# Patient Record
Sex: Female | Born: 1955
Health system: Southern US, Community
[De-identification: ages and names within clinical notes are randomized; demographics above are authoritative.]

## PROBLEM LIST (undated history)

## (undated) DIAGNOSIS — T7840XA Allergy, unspecified, initial encounter: Secondary | ICD-10-CM

## (undated) HISTORY — DX: Allergy, unspecified, initial encounter: T78.40XA

---

## 1998-04-01 ENCOUNTER — Ambulatory Visit (HOSPITAL_COMMUNITY): Admission: RE | Admit: 1998-04-01 | Discharge: 1998-04-01 | Payer: Self-pay | Admitting: Family Medicine

## 1998-08-19 ENCOUNTER — Other Ambulatory Visit: Admission: RE | Admit: 1998-08-19 | Discharge: 1998-08-19 | Payer: Self-pay | Admitting: Specialist

## 1999-07-01 ENCOUNTER — Other Ambulatory Visit: Admission: RE | Admit: 1999-07-01 | Discharge: 1999-07-01 | Payer: Self-pay | Admitting: Gynecology

## 2000-05-13 ENCOUNTER — Encounter: Payer: Self-pay | Admitting: Gynecology

## 2000-05-13 ENCOUNTER — Encounter: Admission: RE | Admit: 2000-05-13 | Discharge: 2000-05-13 | Payer: Self-pay | Admitting: Gynecology

## 2000-05-18 ENCOUNTER — Encounter: Payer: Self-pay | Admitting: Family Medicine

## 2000-05-18 ENCOUNTER — Encounter: Admission: RE | Admit: 2000-05-18 | Discharge: 2000-05-18 | Payer: Self-pay | Admitting: Family Medicine

## 2000-05-26 ENCOUNTER — Other Ambulatory Visit: Admission: RE | Admit: 2000-05-26 | Discharge: 2000-05-26 | Payer: Self-pay | Admitting: Gynecology

## 2000-12-01 ENCOUNTER — Encounter: Payer: Self-pay | Admitting: Family Medicine

## 2000-12-01 ENCOUNTER — Ambulatory Visit (HOSPITAL_COMMUNITY): Admission: RE | Admit: 2000-12-01 | Discharge: 2000-12-01 | Payer: Self-pay | Admitting: Family Medicine

## 2001-05-08 ENCOUNTER — Other Ambulatory Visit: Admission: RE | Admit: 2001-05-08 | Discharge: 2001-05-08 | Payer: Self-pay | Admitting: Gynecology

## 2001-05-15 ENCOUNTER — Encounter: Payer: Self-pay | Admitting: Gynecology

## 2001-05-15 ENCOUNTER — Encounter: Admission: RE | Admit: 2001-05-15 | Discharge: 2001-05-15 | Payer: Self-pay | Admitting: Gynecology

## 2002-06-12 ENCOUNTER — Encounter: Admission: RE | Admit: 2002-06-12 | Discharge: 2002-06-12 | Payer: Self-pay | Admitting: Gynecology

## 2002-06-12 ENCOUNTER — Encounter: Payer: Self-pay | Admitting: Gynecology

## 2002-06-20 ENCOUNTER — Other Ambulatory Visit: Admission: RE | Admit: 2002-06-20 | Discharge: 2002-06-20 | Payer: Self-pay | Admitting: Gynecology

## 2002-08-21 ENCOUNTER — Other Ambulatory Visit: Admission: RE | Admit: 2002-08-21 | Discharge: 2002-08-21 | Payer: Self-pay | Admitting: Gynecology

## 2002-12-31 ENCOUNTER — Other Ambulatory Visit: Admission: RE | Admit: 2002-12-31 | Discharge: 2002-12-31 | Payer: Self-pay | Admitting: Gynecology

## 2003-04-04 ENCOUNTER — Other Ambulatory Visit: Admission: RE | Admit: 2003-04-04 | Discharge: 2003-04-04 | Payer: Self-pay | Admitting: Gynecology

## 2003-06-14 ENCOUNTER — Encounter: Admission: RE | Admit: 2003-06-14 | Discharge: 2003-06-14 | Payer: Self-pay | Admitting: Gynecology

## 2003-06-14 ENCOUNTER — Encounter: Payer: Self-pay | Admitting: Gynecology

## 2003-10-01 ENCOUNTER — Other Ambulatory Visit: Admission: RE | Admit: 2003-10-01 | Discharge: 2003-10-01 | Payer: Self-pay | Admitting: Gynecology

## 2004-03-30 ENCOUNTER — Other Ambulatory Visit: Admission: RE | Admit: 2004-03-30 | Discharge: 2004-03-30 | Payer: Self-pay | Admitting: Gynecology

## 2004-07-06 ENCOUNTER — Encounter: Admission: RE | Admit: 2004-07-06 | Discharge: 2004-07-06 | Payer: Self-pay | Admitting: Gynecology

## 2004-10-17 ENCOUNTER — Emergency Department (HOSPITAL_COMMUNITY): Admission: EM | Admit: 2004-10-17 | Discharge: 2004-10-17 | Payer: Self-pay | Admitting: *Deleted

## 2004-11-16 ENCOUNTER — Other Ambulatory Visit: Admission: RE | Admit: 2004-11-16 | Discharge: 2004-11-16 | Payer: Self-pay | Admitting: Gynecology

## 2005-07-12 ENCOUNTER — Encounter: Admission: RE | Admit: 2005-07-12 | Discharge: 2005-07-12 | Payer: Self-pay | Admitting: Gynecology

## 2005-11-18 ENCOUNTER — Other Ambulatory Visit: Admission: RE | Admit: 2005-11-18 | Discharge: 2005-11-18 | Payer: Self-pay | Admitting: Gynecology

## 2006-07-13 ENCOUNTER — Encounter: Admission: RE | Admit: 2006-07-13 | Discharge: 2006-07-13 | Payer: Self-pay | Admitting: Gynecology

## 2007-01-09 ENCOUNTER — Other Ambulatory Visit: Admission: RE | Admit: 2007-01-09 | Discharge: 2007-01-09 | Payer: Self-pay | Admitting: Gynecology

## 2007-07-17 ENCOUNTER — Encounter: Admission: RE | Admit: 2007-07-17 | Discharge: 2007-07-17 | Payer: Self-pay | Admitting: Gynecology

## 2007-07-20 ENCOUNTER — Encounter: Admission: RE | Admit: 2007-07-20 | Discharge: 2007-07-20 | Payer: Self-pay | Admitting: Gynecology

## 2008-07-17 ENCOUNTER — Encounter: Admission: RE | Admit: 2008-07-17 | Discharge: 2008-07-17 | Payer: Self-pay | Admitting: Gynecology

## 2009-07-21 ENCOUNTER — Encounter: Admission: RE | Admit: 2009-07-21 | Discharge: 2009-07-21 | Payer: Self-pay | Admitting: Gynecology

## 2010-07-22 ENCOUNTER — Encounter: Admission: RE | Admit: 2010-07-22 | Discharge: 2010-07-22 | Payer: Self-pay | Admitting: Gynecology

## 2011-06-17 ENCOUNTER — Other Ambulatory Visit: Payer: Self-pay | Admitting: Gynecology

## 2011-06-17 DIAGNOSIS — Z1231 Encounter for screening mammogram for malignant neoplasm of breast: Secondary | ICD-10-CM

## 2011-07-26 ENCOUNTER — Ambulatory Visit
Admission: RE | Admit: 2011-07-26 | Discharge: 2011-07-26 | Disposition: A | Discharge status: Home / Self Care | Payer: BC Managed Care – PPO | Source: Ambulatory Visit | Attending: Gynecology | Admitting: Gynecology

## 2011-07-26 DIAGNOSIS — Z1231 Encounter for screening mammogram for malignant neoplasm of breast: Secondary | ICD-10-CM

## 2012-07-04 ENCOUNTER — Other Ambulatory Visit: Payer: Self-pay | Admitting: Gynecology

## 2012-07-04 DIAGNOSIS — Z1231 Encounter for screening mammogram for malignant neoplasm of breast: Secondary | ICD-10-CM

## 2012-07-26 ENCOUNTER — Ambulatory Visit
Admission: RE | Admit: 2012-07-26 | Discharge: 2012-07-26 | Disposition: A | Discharge status: Home / Self Care | Payer: BC Managed Care – PPO | Source: Ambulatory Visit | Attending: Gynecology | Admitting: Gynecology

## 2012-07-26 DIAGNOSIS — Z1231 Encounter for screening mammogram for malignant neoplasm of breast: Secondary | ICD-10-CM

## 2013-06-26 ENCOUNTER — Other Ambulatory Visit: Payer: Self-pay

## 2013-06-26 DIAGNOSIS — Z1231 Encounter for screening mammogram for malignant neoplasm of breast: Secondary | ICD-10-CM

## 2013-07-27 ENCOUNTER — Ambulatory Visit: Payer: BC Managed Care – PPO

## 2013-08-02 ENCOUNTER — Ambulatory Visit
Admission: RE | Admit: 2013-08-02 | Discharge: 2013-08-02 | Disposition: A | Discharge status: Home / Self Care | Payer: BC Managed Care – PPO | Source: Ambulatory Visit

## 2013-08-02 DIAGNOSIS — Z1231 Encounter for screening mammogram for malignant neoplasm of breast: Secondary | ICD-10-CM

## 2014-01-02 ENCOUNTER — Other Ambulatory Visit: Payer: Self-pay | Admitting: Family Medicine

## 2014-01-02 ENCOUNTER — Ambulatory Visit
Admission: RE | Admit: 2014-01-02 | Discharge: 2014-01-02 | Disposition: A | Discharge status: Home / Self Care | Payer: BC Managed Care – PPO | Source: Ambulatory Visit | Attending: Family Medicine | Admitting: Family Medicine

## 2014-01-02 DIAGNOSIS — M79609 Pain in unspecified limb: Secondary | ICD-10-CM

## 2014-07-08 ENCOUNTER — Other Ambulatory Visit: Payer: Self-pay

## 2014-07-08 DIAGNOSIS — Z1231 Encounter for screening mammogram for malignant neoplasm of breast: Secondary | ICD-10-CM

## 2014-08-05 ENCOUNTER — Ambulatory Visit
Admission: RE | Admit: 2014-08-05 | Discharge: 2014-08-05 | Disposition: A | Discharge status: Home / Self Care | Payer: BC Managed Care – PPO | Source: Ambulatory Visit

## 2014-08-05 DIAGNOSIS — Z1231 Encounter for screening mammogram for malignant neoplasm of breast: Secondary | ICD-10-CM

## 2015-10-29 ENCOUNTER — Other Ambulatory Visit: Payer: Self-pay | Admitting: Gynecology

## 2015-10-29 DIAGNOSIS — R928 Other abnormal and inconclusive findings on diagnostic imaging of breast: Secondary | ICD-10-CM

## 2015-10-31 ENCOUNTER — Ambulatory Visit
Admission: RE | Admit: 2015-10-31 | Discharge: 2015-10-31 | Disposition: A | Discharge status: Home / Self Care | Payer: BC Managed Care – PPO | Source: Ambulatory Visit | Attending: Gynecology | Admitting: Gynecology

## 2015-10-31 DIAGNOSIS — R928 Other abnormal and inconclusive findings on diagnostic imaging of breast: Secondary | ICD-10-CM

## 2016-09-23 ENCOUNTER — Other Ambulatory Visit: Payer: Self-pay | Admitting: Gynecology

## 2016-09-23 DIAGNOSIS — Z1231 Encounter for screening mammogram for malignant neoplasm of breast: Secondary | ICD-10-CM

## 2016-10-29 ENCOUNTER — Ambulatory Visit
Admission: RE | Admit: 2016-10-29 | Discharge: 2016-10-29 | Disposition: A | Discharge status: Home / Self Care | Payer: BC Managed Care – PPO | Source: Ambulatory Visit | Attending: Gynecology | Admitting: Gynecology

## 2016-10-29 DIAGNOSIS — Z1231 Encounter for screening mammogram for malignant neoplasm of breast: Secondary | ICD-10-CM

## 2017-09-21 ENCOUNTER — Other Ambulatory Visit: Payer: Self-pay | Admitting: Gynecology

## 2017-09-21 DIAGNOSIS — Z1231 Encounter for screening mammogram for malignant neoplasm of breast: Secondary | ICD-10-CM

## 2017-10-31 ENCOUNTER — Encounter (INDEPENDENT_AMBULATORY_CARE_PROVIDER_SITE_OTHER): Payer: Self-pay

## 2017-10-31 ENCOUNTER — Ambulatory Visit
Admission: RE | Admit: 2017-10-31 | Discharge: 2017-10-31 | Disposition: A | Discharge status: Home / Self Care | Payer: BC Managed Care – PPO | Source: Ambulatory Visit | Attending: Gynecology | Admitting: Gynecology

## 2017-10-31 DIAGNOSIS — Z1231 Encounter for screening mammogram for malignant neoplasm of breast: Secondary | ICD-10-CM

## 2018-09-19 ENCOUNTER — Other Ambulatory Visit: Payer: Self-pay | Admitting: Gynecology

## 2018-09-19 DIAGNOSIS — Z1231 Encounter for screening mammogram for malignant neoplasm of breast: Secondary | ICD-10-CM

## 2018-11-01 ENCOUNTER — Ambulatory Visit
Admission: RE | Admit: 2018-11-01 | Discharge: 2018-11-01 | Disposition: A | Discharge status: Home / Self Care | Payer: BC Managed Care – PPO | Source: Ambulatory Visit | Attending: Gynecology | Admitting: Gynecology

## 2018-11-01 DIAGNOSIS — Z1231 Encounter for screening mammogram for malignant neoplasm of breast: Secondary | ICD-10-CM

## 2019-01-09 ENCOUNTER — Encounter: Payer: Self-pay | Admitting: Internal Medicine

## 2019-02-05 ENCOUNTER — Other Ambulatory Visit: Payer: Self-pay

## 2019-02-05 ENCOUNTER — Telehealth: Payer: Self-pay | Admitting: Internal Medicine

## 2019-02-05 ENCOUNTER — Encounter: Payer: Self-pay | Admitting: Internal Medicine

## 2019-02-05 ENCOUNTER — Ambulatory Visit (INDEPENDENT_AMBULATORY_CARE_PROVIDER_SITE_OTHER): Payer: BC Managed Care – PPO | Admitting: Internal Medicine

## 2019-02-05 DIAGNOSIS — L649 Androgenic alopecia, unspecified: Secondary | ICD-10-CM

## 2019-02-05 DIAGNOSIS — E278 Other specified disorders of adrenal gland: Secondary | ICD-10-CM | POA: Diagnosis not present

## 2019-02-05 DIAGNOSIS — R7989 Other specified abnormal findings of blood chemistry: Secondary | ICD-10-CM | POA: Insufficient documentation

## 2019-02-05 NOTE — Telephone Encounter (Signed)
No need to stop any of the other medicines. She may want to discuss any concerns with the prescribing physician.

## 2019-02-05 NOTE — Progress Notes (Signed)
Virtual Visit via Video Note  I connected with Tammy [name] on 02/05/19 at  9:10 AM EDT by a video enabled telemedicine application and verified that I am speaking with the correct person using two identifiers.   I discussed the limitations of evaluation and management by telemedicine and the availability of in person appointments. The patient expressed understanding and agreed to proceed.  -Location of the patient : Home -Location of the provider : office -The names of all persons participating in the telemedicine service : CMA Lelon Frohlichashia Golden     Name: Tammy [name]  MRN/ DOB: [id number], 05/04/1956    Age/ Sex: 63 y.o., female    PCP: Johny BlamerHarris, William, MD   Reason for Endocrinology Evaluation: Androgenic alopecia     Date of Initial Endocrinology Evaluation: 02/05/2019     HPI: Tammy [name] is a 63 y.o. female with a past medical history of seasonal allergies. The patient presented for initial endocrinology clinic visit on 02/05/2019 for consultative assistance with her Androgenic Alopecia  Pt was referred to us by dermatology for the possibility of androgenic alopecia after she was found to have an elevated DHEAS of 190 (reference 12-133 mcg/dL) , with normal testosterone profile (total and free)   Pt noted excessive hair loss for ~ 4 -5 months prior to her presentation. She also noted pruritus at the area of concern and has been scratching her scalp. During dermatology visit she was noted to have hair thinning over the left vertex scalp.   Pt does see a hair stylist every 2 weeks for wash, styling and hair drying, she also has a hair relaxer applied every 2 months.    She denies any recent depression or anxiety.  She denies any excessive hair growth over the face or the rest of her body.  She is on estrogen and progesterone through Gyn  She has noted weight gain but denies any issues with blood pressure, glucose or new striae.   Denies heat/cold intolerance.    Menarche at age 63 Menopause in her late 1240's.   She has been on some form of contraception most of her life, no prior hx of irregular period. No difficulty conceiving in the past.      HISTORY:  Past Medical History:  Past Medical History:  Diagnosis Date  . Allergy     Past Surgical History: No past surgical history on file.   Social History:  reports that she has never smoked. She has never used smokeless tobacco.  Family History: family history includes Hypertension in her mother.   HOME MEDICATIONS: Allergies as of 02/05/2019      Reactions   Cortisporin  [bacitra-neomycin-polymyxin-hc]       Medication List       Accurate as of February 05, 2019  9:50 AM. Always use your most recent med list.        aspirin EC 81 MG tablet Take 81 mg by mouth daily.   Biotin 4098110000 MCG Tabs Take by mouth.   calcium carbonate 1500 (600 Ca) MG Tabs tablet Commonly known as:  OSCAL Take by mouth 2 (two) times daily with a meal.   CENTRUM ULTRA WOMENS PO Take by mouth.   CLARITIN PO Take by mouth.   COLLAGEN PO Take by mouth.   D3-50 PO Take by mouth.   Diclofenac Sodium 3 % Gel   dicyclomine 20 MG tablet Commonly known as:  BENTYL Take 20 mg by mouth every 6 (six) hours.  estradiol 0.05 MG/24HR patch Commonly known as:  VIVELLE-DOT estradiol 0.05 mg/24 hr semiweekly transdermal patch   fluocinonide 0.05 % external solution Commonly known as:  LIDEX APPLY ON THE SKIN AS DIRECTED AS NEEDED FOR ITCH   fluticasone 50 MCG/ACT nasal spray Commonly known as:  FLONASE fluticasone propionate 50 mcg/actuation nasal spray,suspension   indapamide 1.25 MG tablet Commonly known as:  LOZOL   IRON PO Take 65 mg by mouth.   meloxicam 15 MG tablet Commonly known as:  MOBIC Take 15 mg by mouth daily as needed.   olopatadine 0.1 % ophthalmic solution Commonly known as:  PATANOL   progesterone 200 MG capsule Commonly known as:  PROMETRIUM TAKE 1 CAPSULE BY MOUTH  AT BEDTIME FOR 14 DAYS*EVERY 3 MONTHS   topiramate 25 MG tablet Commonly known as:  TOPAMAX   VITAMINS FOR THE HAIR PO Take by mouth.         REVIEW OF SYSTEMS: A comprehensive ROS was conducted with the patient and is negative except as per HPI and below:  Review of Systems  Constitutional: Negative for malaise/fatigue and weight loss.  HENT: Negative for congestion and sore throat.   Eyes: Negative for blurred vision and pain.  Respiratory: Negative for cough and shortness of breath.   Cardiovascular: Negative for chest pain and palpitations.  Gastrointestinal: Negative for constipation and diarrhea.  Genitourinary: Negative for frequency.  Musculoskeletal: Positive for joint pain.  Skin: Positive for itching.  Neurological: Positive for tingling. Negative for tremors.  Endo/Heme/Allergies: Negative for polydipsia.  Psychiatric/Behavioral: Negative for depression. The patient is not nervous/anxious.         DATA REVIEWED: 01/09/2019   Total testosterone 18 (2-45) ng/dL  Free T 1.3 (7.8-6.7 ) pg/mL  DHEAS 190 (12-133) mcg/dL    Old records , labs and images have been reviewed.   ASSESSMENT/PLAN/RECOMMENDATIONS:   1. Elevated DHEAS  - This is mildly elevated, clinically she has no other features of  virilization - We will repeat labs on next visit, DHEAS levels 3x the upper end of normal , will require further work up.  - Reassurance provided at this time.    2. Androgenic Alopecia:  - Discussed D/D of androgenic source, chemical irritant, vs fungus - I am not sure if her current levels of DHEAS is a contributing factor, will need to repeat testing to include TFT's. She has normal TFT's in 10/2018 but she was on biotin at the time, I have advised her to hold off on Biotin use for 72 hrs prior to her next appointment with me.  - Due to COVID-19 will set her up for lab work in 3 months.    I discussed the assessment and treatment plan with the patient. The  patient was provided an opportunity to ask questions and all were answered. The patient agreed with the plan and demonstrated an understanding of the instructions.   The patient was advised to call back or seek an in-person evaluation if the symptoms worsen or if the condition fails to improve as anticipated.   F/u in 3 months    Signed electronically by: Lyndle Herrlich, MD  [facility] Endocrinology  [facility] [address] [place], [place] [address] Phone: [phone] FAX: [phone]   CC: Johny Blamer, MD [address] [place] [address] Phone: [phone] Fax: [phone]   Return to Endocrinology clinic as below: No future appointments.

## 2019-02-05 NOTE — Telephone Encounter (Signed)
Pt informed

## 2019-02-05 NOTE — Telephone Encounter (Signed)
Patient has one more question for the doctor, Should the patient continue taking that medicine she was concerned of hair loss or no?  Please Advise, Thanks

## 2019-02-05 NOTE — Telephone Encounter (Signed)
Pt was referring to pantoprazole,progesterone,topamax, and estradiol. I did inform her that you had not informed her to stop any of these, as pt had stopped these medications prior to visit today due to reading about hair loss was a side effect of these medications.

## 2019-02-05 NOTE — Telephone Encounter (Signed)
Please advise 

## 2019-05-07 ENCOUNTER — Ambulatory Visit: Payer: BC Managed Care – PPO | Admitting: Internal Medicine

## 2019-05-14 ENCOUNTER — Ambulatory Visit: Payer: BC Managed Care – PPO | Admitting: Internal Medicine

## 2019-05-14 ENCOUNTER — Other Ambulatory Visit: Payer: Self-pay

## 2019-05-14 ENCOUNTER — Encounter: Payer: Self-pay | Admitting: Internal Medicine

## 2019-05-14 VITALS — BP 132/88 | HR 76 | Temp 97.9°F | Ht 61.0 in | Wt 128.4 lb

## 2019-05-14 DIAGNOSIS — L649 Androgenic alopecia, unspecified: Secondary | ICD-10-CM | POA: Diagnosis not present

## 2019-05-14 DIAGNOSIS — R7989 Other specified abnormal findings of blood chemistry: Secondary | ICD-10-CM

## 2019-05-14 LAB — T4, FREE: Free T4: 1 ng/dL (ref 0.60–1.60)

## 2019-05-14 LAB — BASIC METABOLIC PANEL
BUN: 12 mg/dL (ref 6–23)
CO2: 24 mEq/L (ref 19–32)
Calcium: 9.5 mg/dL (ref 8.4–10.5)
Chloride: 109 mEq/L (ref 96–112)
Creatinine, Ser: 0.99 mg/dL (ref 0.40–1.20)
GFR: 68.35 mL/min (ref >60.00)
Glucose, Bld: 90 mg/dL (ref 70–99)
Potassium: 3.4 mEq/L — ABNORMAL LOW (ref 3.5–5.1)
Sodium: 142 mEq/L (ref 135–145)

## 2019-05-14 LAB — TSH: TSH: 0.14 u[IU]/mL — ABNORMAL LOW (ref 0.35–4.50)

## 2019-05-14 NOTE — Progress Notes (Signed)
Name: Tammy [name]  MRN/ DOB: [id number], 1955/10/30    Age/ Sex: 63 y.o., female     PCP: Shirline Frees, MD   Reason for Endocrinology Evaluation: Androgenic alopecia     Initial Endocrinology Clinic Visit: 02/05/2019    PATIENT IDENTIFIER: [name] is a 63 y.o., female with a past medical history of seasonal allergies and androgenic alopecia. She has followed with [facility] since 02/05/2019 for consultative assistance with management of her androgenic alopecia.   HISTORICAL SUMMARY: The patient was referred to [place] by dermatology for the possibility of androgenic alopecia after she was found to have an elevated DHEAS of 190 (reference 12-133 mcg/dL) , with normal testosterone profile (total and free)   Pt noted excessive hair loss for ~ 4 -5 months prior to her presentation. She also noted pruritus at the area of concern and has been scratching her scalp. During dermatology visit she was noted to have hair thinning over the left vertex scalp.    She has been on some form of contraception most of her life, no prior hx of irregular period. No difficulty conceiving in the past.   SUBJECTIVE:    Today (05/14/2019):  Tammy [name] is here for a follow up on androgenic alopecia and elevated DHEAS. She had stopped using  a relaxer in the past 6 months, she has noted some improvement.  Denies any excessive hair growth , no deepning of the voice, no muscle growth, denies clitoral enlargement.   Has stopped Biotin intake a week ago   ROS:  As per HPI.   HISTORY:  Past Medical History:  Past Medical History:  Diagnosis Date  . Allergy     Past Surgical History: No past surgical history on file.  Social History:  reports that she has never smoked. She has never used smokeless tobacco. No history on file for alcohol and drug. Family History:  Family History  Problem Relation Age of Onset  . Hypertension Mother      HOME MEDICATIONS: Allergies  as of 05/14/2019      Reactions   Cortisporin  [bacitra-neomycin-polymyxin-hc]       Medication List       Accurate as of May 14, 2019  3:22 PM. If you have any questions, ask your nurse or doctor.        aspirin EC 81 MG tablet Take 81 mg by mouth daily.   Biotin 10000 MCG Tabs Take by mouth.   calcium carbonate 1500 (600 Ca) MG Tabs tablet Commonly known as: OSCAL Take by mouth 2 (two) times daily with a meal.   CENTRUM ULTRA WOMENS PO Take by mouth.   CLARITIN PO Take by mouth.   COLLAGEN PO Take by mouth.   D3-50 PO Take by mouth.   Diclofenac Sodium 3 % Gel   dicyclomine 20 MG tablet Commonly known as: BENTYL Take 20 mg by mouth every 6 (six) hours.   estradiol 0.05 MG/24HR patch Commonly known as: VIVELLE-DOT estradiol 0.05 mg/24 hr semiweekly transdermal patch   fluocinonide 0.05 % external solution Commonly known as: LIDEX APPLY 1ML ON THE SKIN AS DIRECTED AS NEEDED FOR ITCH   fluticasone 50 MCG/ACT nasal spray Commonly known as: FLONASE fluticasone propionate 50 mcg/actuation nasal spray,suspension   indapamide 1.25 MG tablet Commonly known as: LOZOL   IRON PO Take 65 mg by mouth.   meloxicam 15 MG tablet Commonly known as: MOBIC Take 15 mg by mouth daily as needed.  olopatadine 0.1 % ophthalmic solution Commonly known as: PATANOL   progesterone 200 MG capsule Commonly known as: PROMETRIUM TAKE 1 CAPSULE BY MOUTH AT BEDTIME FOR 14 DAYS*EVERY 3 MONTHS   topiramate 25 MG tablet Commonly known as: TOPAMAX   VITAMINS FOR THE HAIR PO Take by mouth.         OBJECTIVE:   PHYSICAL EXAM: VS: BP 132/88 (BP Location: Left Arm, Patient Position: Sitting, Cuff Size: Normal)   Pulse 76   Temp 97.9 F (36.6 C)   Ht 5\' 1"  (1.549 m)   Wt 128 lb 6.4 oz (58.2 kg)   LMP 07/26/2011   SpO2 98%   BMI 24.26 kg/m    EXAM: General: Pt appears well and is in NAD  Neck: General: Supple without adenopathy. Thyroid: Thyroid size normal.  No  goiter or nodules appreciated. No thyroid bruit.  Lungs: Clear with good BS bilat with no rales, rhonchi, or wheezes  Heart: Auscultation: RRR.  Abdomen: Normoactive bowel sounds, soft, nontender, without masses or organomegaly palpable  Extremities:  BL LE: No pretibial edema normal ROM and strength.  Skin: Hair: Texture and amount normal with gender appropriate distribution Skin Inspection: No rashes, acanthosis nigricans/skin tags. Skin Palpation: Skin temperature, texture, and thickness normal to palpation  Neuro: Cranial nerves: II - XII grossly intact  Motor: Normal strength throughout DTRs: 2+ and symmetric in UE without delay in relaxation phase  Mental Status: Judgment, insight: Intact Orientation: Oriented to time, place, and person Mood and affect: No depression, anxiety, or agitation     DATA REVIEWED:  01/09/2019  Total testosterone 18 (2-45) ng/dL  Free T 1.3 (9.5-6.20.2-5.0 ) pg/mL  DHEAS 190 (12-133) mcg/dL     Results for Coralie CommonCRITE, Alencia B (MRN [id number]) as of 05/18/2019 15:38  Ref. Range 05/14/2019 09:26 05/14/2019 12:29  Sodium Latest Ref Range: 135 - 145 mEq/L 142   Potassium Latest Ref Range: 3.5 - 5.1 mEq/L 3.4 (L)   Chloride Latest Ref Range: 96 - 112 mEq/L 109   CO2 Latest Ref Range: 19 - 32 mEq/L 24   Glucose Latest Ref Range: 70 - 99 mg/dL 90   BUN Latest Ref Range: 6 - 23 mg/dL 12   Creatinine Latest Ref Range: 0.40 - 1.20 mg/dL 6.960.99   Calcium Latest Ref Range: 8.4 - 10.5 mg/dL 9.5   GFR Latest Ref Range: >60.00 mL/min 68.35   DHEA-SO4 Latest Ref Range: 12 - 133 mcg/dL 295146 (H)   LH Latest Units: mIU/mL 124.2 (H)   FSH Latest Units: mIU/mL >200.0 (H)   Prolactin Latest Units: ng/mL 8.2   Sex Horm Binding Glob, Serum Latest Ref Range: 17.3 - 125.0 nmol/L  55.1  Testosterone Latest Ref Range: 3 - 41 ng/dL  11  Testosterone Free Latest Ref Range: 0.0 - 4.2 pg/mL  1.3  TSH Latest Ref Range: 0.35 - 4.50 uIU/mL 0.14 (L)   T4,Free(Direct) Latest Ref Range: 0.60  - 1.60 ng/dL 2.841.00   Results for Coralie CommonCRITE, Fumie B (MRN [id number]) as of 05/18/2019 15:38  Ref. Range 05/14/2019 15:50  TRAB Latest Ref Range: <=2.00 IU/L 4.49 (H)    ASSESSMENT / PLAN / RECOMMENDATIONS:   1) Elevated DHEAS:   - We will repeat levels today in addition to testosterone levels.  - Her alopecia seems to have been improving.  - Pt understands if DHEAS levels continue to be elevated, will proceed with scanning the adrenal glands.     2) Subclinical Hyperthyroidism Secondary to Graves' Disease  - Clinically  she is euthyroid  - No local neck symptoms.  - TRAb elevated  - Will rechech on next visit  - Will consider treatment should the pt have symptoms of hyperthyroidism vs a TSH < 0.10 uIU/mL    F/u in 3 months    Labs discussed with the patient on 05/17/19   Signed electronically by: Lyndle HerrlichAbby Jaralla Shamleffer, MD  [facility]  [facility] [address] [place], [place] [address] Phone: [phone] FAX: [phone]      CC: Johny BlamerHarris, William, MD [address] [place] [address] Phone: [phone]  Fax: [phone]   Return to Endocrinology clinic as below: No future appointments.

## 2019-05-14 NOTE — Patient Instructions (Signed)
-   Please stop by the lab today, we will contact you with results.

## 2019-05-16 LAB — TESTOSTERONE, FREE, TOTAL, SHBG
Sex Hormone Binding: 55.1 nmol/L (ref 17.3–125.0)
Testosterone, Free: 1.3 pg/mL (ref 0.0–4.2)
Testosterone: 11 ng/dL (ref 3–41)

## 2019-05-17 LAB — TRAB (TSH RECEPTOR BINDING ANTIBODY): TRAB: 4.49 IU/L — ABNORMAL HIGH (ref <=2.00)

## 2019-05-18 ENCOUNTER — Telehealth: Payer: Self-pay | Admitting: Internal Medicine

## 2019-05-18 LAB — FSH/LH
FSH: >200 m[IU]/mL — ABNORMAL HIGH
LH: 124.2 m[IU]/mL — ABNORMAL HIGH

## 2019-05-18 LAB — TESTOSTERONE, FREE & TOTAL
Free Testosterone: 2.2 pg/mL (ref 0.1–6.4)
Testosterone, Total, LC-MS-MS: 20 ng/dL (ref 2–45)

## 2019-05-18 LAB — DHEA-SULFATE: DHEA-SO4: 146 ug/dL — ABNORMAL HIGH (ref 12–133)

## 2019-05-18 LAB — PROLACTIN: Prolactin: 8.2 ng/mL

## 2019-05-18 LAB — 17-HYDROXYPROGESTERONE: 17-OH-Progesterone, LC/MS/MS: <8 ng/dL

## 2019-05-18 NOTE — Telephone Encounter (Signed)
Attempted to call the patient to discuss her elevated TRAb level and new diagnosis of Graves' disease. There was no answer and I was unable to leave a message because her mail box is full.    Will discuss this on next visit in 3 months   No change in management unless FT4 is elevated  Or TSH falls below 0.10 uIU/mL      Abby Nena Jordan, MD  [facility] Endocrinology  [facility] [address] [place], [place] [address] Phone: [phone] FAX: [phone]

## 2019-07-10 ENCOUNTER — Other Ambulatory Visit: Payer: Self-pay | Admitting: Gynecology

## 2019-07-10 DIAGNOSIS — Z1231 Encounter for screening mammogram for malignant neoplasm of breast: Secondary | ICD-10-CM

## 2019-08-02 ENCOUNTER — Other Ambulatory Visit: Payer: Self-pay

## 2019-08-03 NOTE — Progress Notes (Signed)
Name: Tammy [name]  MRN/ DOB: [id number], [birth year]    Age/ Sex: 63 y.o., female     PCP: Shirline Frees, MD   Reason for Endocrinology Evaluation: Androgenic alopecia/ Graves' Disease     Initial Endocrinology Clinic Visit: 02/05/2019    PATIENT IDENTIFIER: Tammy [name] is a 63 y.o., female with a past medical history of seasonal allergies and androgenic alopecia. She has followed with [facility] since 02/05/2019 for consultative assistance with management of her androgenic alopecia.   HISTORICAL SUMMARY: The patient was referred to [place] by dermatology for the possibility of androgenic alopecia after she was found to have an elevated DHEAS of 190 (reference 12-133 mcg/dL) , with normal testosterone profile (total and free)   Pt noted excessive hair loss for ~ 4 -5 months prior to her presentation. She also noted pruritus at the area of concern and has been scratching her scalp. During dermatology visit she was noted to have hair thinning over the left vertex scalp.    She has been on some form of contraception most of her life, no prior hx of irregular period. No difficulty conceiving in the past.   Found to have graves disease with elevated TRAb and low TSH (0.14 uIu/mL ) but normal FT4 in 04/2019  SUBJECTIVE:   During last visit (05/14/2019): DHEAS levels trending down . Found to have subclinical hyperthyroidism and positive TRAb  Today (08/06/2019):  Tammy [name] is here for a follow up on androgenic alopecia ,elevated DHEAS and Graves' Disease.   Her weight continues to fluctuate .  Denies any excessive hair loss nor hirsutism , no deepning of the voice, no muscle growth.  Denies palpitations and diarrhea  Denies anxiety or jittery sensation  Continues to take Biotin      ROS:  As per HPI.   HISTORY:  Past Medical History:  Past Medical History:  Diagnosis Date   Allergy     Past Surgical History: No past surgical history on  file.  Social History:  reports that she has never smoked. She has never used smokeless tobacco. No history on file for alcohol and drug. Family History:  Family History  Problem Relation Age of Onset   Hypertension Mother      HOME MEDICATIONS: Allergies as of 08/06/2019      Reactions   Cortisporin  [bacitra-neomycin-polymyxin-hc]       Medication List       Accurate as of August 06, 2019 12:26 PM. If you have any questions, ask your nurse or doctor.        aspirin EC 81 MG tablet Take 81 mg by mouth daily.   Biotin 10000 MCG Tabs Take by mouth.   calcium carbonate 1500 (600 Ca) MG Tabs tablet Commonly known as: OSCAL Take by mouth 2 (two) times daily with a meal.   CENTRUM ULTRA WOMENS PO Take by mouth.   CLARITIN PO Take by mouth.   COLLAGEN PO Take by mouth.   D3-50 PO Take by mouth.   Diclofenac Sodium 3 % Gel   dicyclomine 20 MG tablet Commonly known as: BENTYL Take 20 mg by mouth every 6 (six) hours.   estradiol 0.05 MG/24HR patch Commonly known as: VIVELLE-DOT estradiol 0.05 mg/24 hr semiweekly transdermal patch   fluocinonide 0.05 % external solution Commonly known as: LIDEX APPLY 1ML ON THE SKIN AS DIRECTED AS NEEDED FOR ITCH   fluticasone 50 MCG/ACT nasal spray Commonly known as: FLONASE fluticasone propionate 50 mcg/actuation nasal  spray,suspension   indapamide 1.25 MG tablet Commonly known as: LOZOL   IRON PO Take 65 mg by mouth.   meloxicam 15 MG tablet Commonly known as: MOBIC Take 15 mg by mouth daily as needed.   olopatadine 0.1 % ophthalmic solution Commonly known as: PATANOL   progesterone 200 MG capsule Commonly known as: PROMETRIUM TAKE 1 CAPSULE BY MOUTH AT BEDTIME FOR 14 DAYS*EVERY 3 MONTHS   topiramate 25 MG tablet Commonly known as: TOPAMAX   VITAMINS FOR THE HAIR PO Take by mouth.         OBJECTIVE:   PHYSICAL EXAM: VS: BP 132/82 (BP Location: Left Arm, Patient Position: Sitting, Cuff Size:  Normal)    Pulse 80    Temp 98.3 F (36.8 C)    Ht 5\' 1"  (1.549 m)    Wt 130 lb (59 kg)    LMP 07/26/2011    SpO2 99%    BMI 24.56 kg/m    EXAM: General: Pt appears well and is in NAD  Neck: General: Supple without adenopathy. Thyroid: Thyroid size is prominent ~50 grams.  No nodules appreciated. No thyroid bruit.  Lungs: Clear with good BS bilat with no rales, rhonchi, or wheezes  Heart: Auscultation: RRR.  Abdomen: Normoactive bowel sounds, soft, nontender, without masses or organomegaly palpable  Extremities:  BL LE: No pretibial edema normal ROM and strength.  Skin: Hair: Texture and amount normal with gender appropriate distribution Skin Inspection: No rashes, acanthosis nigricans/skin tags. Skin Palpation: Skin temperature, texture, and thickness normal to palpation  Neuro: Cranial nerves: II - XII grossly intact  Motor: Normal strength throughout DTRs: 2+ and symmetric in UE without delay in relaxation phase  Mental Status: Judgment, insight: Intact Orientation: Oriented to time, place, and person Mood and affect: No depression, anxiety, or agitation     DATA REVIEWED:  01/09/2019  Total testosterone 18 (2-45) ng/dL  Free T 1.3 (01/11/2019 ) pg/mL  DHEAS 190 (12-133) mcg/dL     Results for Tammy [name], Tammy [name] (MRN Coralie Common) as of 05/18/2019 15:38  Ref. Range 05/14/2019 09:26 05/14/2019 12:29  Sodium Latest Ref Range: 135 - 145 mEq/L 142   Potassium Latest Ref Range: 3.5 - 5.1 mEq/L 3.4 (L)   Chloride Latest Ref Range: 96 - 112 mEq/L 109   CO2 Latest Ref Range: 19 - 32 mEq/L 24   Glucose Latest Ref Range: 70 - 99 mg/dL 90   BUN Latest Ref Range: 6 - 23 mg/dL 12   Creatinine Latest Ref Range: 0.40 - 1.20 mg/dL 05/16/2019   Calcium Latest Ref Range: 8.4 - 10.5 mg/dL 9.5   GFR Latest Ref Range: >60.00 mL/min 68.35   DHEA-SO4 Latest Ref Range: 12 - 133 mcg/dL 0.34 (H)   LH Latest Units: mIU/mL 124.2 (H)   FSH Latest Units: mIU/mL >200.0 (H)   Prolactin Latest Units: ng/mL 8.2    Sex Horm Binding Glob, Serum Latest Ref Range: 17.3 - 125.0 nmol/L  55.1  Testosterone Latest Ref Range: 3 - 41 ng/dL  11  Testosterone Free Latest Ref Range: 0.0 - 4.2 pg/mL  1.3  TSH Latest Ref Range: 0.35 - 4.50 uIU/mL 0.14 (L)   T4,Free(Direct) Latest Ref Range: 0.60 - 1.60 ng/dL 742   Results for Tammy [name], Tammy [name] (MRN Coralie Common) as of 05/18/2019 15:38  Ref. Range 05/14/2019 15:50  TRAB Latest Ref Range: <=2.00 IU/L 4.49 (H)    ASSESSMENT / PLAN / RECOMMENDATIONS:   1) Elevated DHEAS:   - Her alopecia seems to have  been stable - Testosterone levels normal.  - DHEAS > 700 mcg/dL requires further evaluation. Hers has been minimally elevated  - No clinical  evidence of cushing syndrome , no prolactinoma   - Will repeat DHEAS level and monitor the trend   2) Subclinical Hyperthyroidism Secondary to Graves' Disease  - Clinically she is euthyroid  - No local neck symptoms.  - TRAb elevated  - Unfortunately we will not be able to check labs today because she did not stop Biotin as advised on last visit, she will stop by sometime this week.  - Will consider treatment should the pt have symptoms of hyperthyroidism vs a TSH < 0.10 uIU/mL  We discussed that Graves' Disease is a result of an autoimmune condition involving the thyroid.   -We discussed with pt the benefits of methimazole in the Tx of hyperthyroidism, as well as the possible side effects/complications of anti-thyroid drug Tx (specifically detailing the rare, but serious side effect of agranulocytosis). She was informed of need for regular thyroid function monitoring while on methimazole to ensure appropriate dosage without over-treatment. As well, we discussed the possible side effects of methimazole including the chance of rash, the small chance of liver irritation/juandice and the <=1 in 300-400 chance of sudden onset agranulocytosis.  We discussed importance of going to ED promptly (and stopping methimazole) if shewere to  develop significant fever with severe sore throat of other evidence of acute infection.     We extensively discussed the various treatment options for hyperthyroidism and Graves disease including ablation therapy with radioactive iodine versus antithyroid drug treatment versus surgical therapy.  We recommended to the patient that we felt, at this time, that no therapy would be most optimal.  We discussed the various possible benefits versus side effects of the various therapies.   I carefully explained to the patient that one of the consequences of I-131 ablation treatment would likely be permanent hypothyroidism which would require long-term replacement therapy with LT4.   - Will check TSH and FT4   F/u in 4 months      Signed electronically by: Lyndle HerrlichAbby Jaralla Bartosz Luginbill, MD  [facility]  [facility] [address] [place], [place] [address] Phone: [phone] FAX: [phone]      CC: Johny BlamerHarris, William, MD [address] [place] [address] Phone: [phone]  Fax: [phone]   Return to Endocrinology clinic as below: Future Appointments  Date Time Provider Department Center  08/09/2019  8:30 AM [facility] LAB [facility] None  11/05/2019 10:00 AM [facility] MM 2 [facility] CE  11/08/2019  8:15 AM [facility] LAB [facility] None  12/11/2019  7:30 AM Francille Wittmann, Konrad DoloresIbtehal Jaralla, MD [facility] None

## 2019-08-06 ENCOUNTER — Ambulatory Visit (INDEPENDENT_AMBULATORY_CARE_PROVIDER_SITE_OTHER): Payer: BC Managed Care – PPO | Admitting: Internal Medicine

## 2019-08-06 ENCOUNTER — Other Ambulatory Visit: Payer: Self-pay

## 2019-08-06 ENCOUNTER — Encounter: Payer: Self-pay | Admitting: Internal Medicine

## 2019-08-06 VITALS — BP 132/82 | HR 80 | Temp 98.3°F | Ht 61.0 in | Wt 130.0 lb

## 2019-08-06 DIAGNOSIS — E0500 Thyrotoxicosis with diffuse goiter without thyrotoxic crisis or storm: Secondary | ICD-10-CM | POA: Diagnosis not present

## 2019-08-06 DIAGNOSIS — E0590 Thyrotoxicosis, unspecified without thyrotoxic crisis or storm: Secondary | ICD-10-CM | POA: Diagnosis not present

## 2019-08-06 DIAGNOSIS — L649 Androgenic alopecia, unspecified: Secondary | ICD-10-CM

## 2019-08-06 NOTE — Patient Instructions (Signed)
-   Please remember to hold " Biotin" or any vitamins that says its "good for hair skin and nails " for 2 days prior to lab appointments.

## 2019-08-09 ENCOUNTER — Other Ambulatory Visit: Payer: Self-pay

## 2019-08-09 ENCOUNTER — Other Ambulatory Visit (INDEPENDENT_AMBULATORY_CARE_PROVIDER_SITE_OTHER): Payer: BC Managed Care – PPO

## 2019-08-09 DIAGNOSIS — L649 Androgenic alopecia, unspecified: Secondary | ICD-10-CM

## 2019-08-09 DIAGNOSIS — E0500 Thyrotoxicosis with diffuse goiter without thyrotoxic crisis or storm: Secondary | ICD-10-CM

## 2019-08-09 LAB — COMPREHENSIVE METABOLIC PANEL
ALT: 17 U/L (ref 0–35)
AST: 23 U/L (ref 0–37)
Albumin: 4.4 g/dL (ref 3.5–5.2)
Alkaline Phosphatase: 45 U/L (ref 39–117)
BUN: 16 mg/dL (ref 6–23)
CO2: 26 mEq/L (ref 19–32)
Calcium: 9.6 mg/dL (ref 8.4–10.5)
Chloride: 107 mEq/L (ref 96–112)
Creatinine, Ser: 1.02 mg/dL (ref 0.40–1.20)
GFR: 65.99 mL/min (ref >60.00)
Glucose, Bld: 94 mg/dL (ref 70–99)
Potassium: 4.1 mEq/L (ref 3.5–5.1)
Sodium: 140 mEq/L (ref 135–145)
Total Bilirubin: 0.5 mg/dL (ref 0.2–1.2)
Total Protein: 7.4 g/dL (ref 6.0–8.3)

## 2019-08-09 LAB — CBC WITH DIFFERENTIAL/PLATELET
Basophils Absolute: 0 10*3/uL (ref 0.0–0.1)
Basophils Relative: 0.6 % (ref 0.0–3.0)
Eosinophils Absolute: 0.2 10*3/uL (ref 0.0–0.7)
Eosinophils Relative: 2.3 % (ref 0.0–5.0)
HCT: 35.4 % — ABNORMAL LOW (ref 36.0–46.0)
Hemoglobin: 11.7 g/dL — ABNORMAL LOW (ref 12.0–15.0)
Lymphocytes Relative: 27.4 % (ref 12.0–46.0)
Lymphs Abs: 1.8 10*3/uL (ref 0.7–4.0)
MCHC: 33.1 g/dL (ref 30.0–36.0)
MCV: 94 fl (ref 78.0–100.0)
Monocytes Absolute: 0.4 10*3/uL (ref 0.1–1.0)
Monocytes Relative: 6.9 % (ref 3.0–12.0)
Neutro Abs: 4.1 10*3/uL (ref 1.4–7.7)
Neutrophils Relative %: 62.8 % (ref 43.0–77.0)
Platelets: 395 10*3/uL (ref 150.0–400.0)
RBC: 3.77 Mil/uL — ABNORMAL LOW (ref 3.87–5.11)
RDW: 14.7 % (ref 11.5–15.5)
WBC: 6.5 10*3/uL (ref 4.0–10.5)

## 2019-08-09 LAB — T4, FREE: Free T4: 1.23 ng/dL (ref 0.60–1.60)

## 2019-08-09 LAB — TSH: TSH: 0.04 u[IU]/mL — ABNORMAL LOW (ref 0.35–4.50)

## 2019-08-10 ENCOUNTER — Telehealth: Payer: Self-pay | Admitting: Internal Medicine

## 2019-08-10 ENCOUNTER — Telehealth: Payer: Self-pay

## 2019-08-10 DIAGNOSIS — E0500 Thyrotoxicosis with diffuse goiter without thyrotoxic crisis or storm: Secondary | ICD-10-CM

## 2019-08-10 LAB — DHEA-SULFATE: DHEA-SO4: 139 ug/dL — ABNORMAL HIGH (ref 12–133)

## 2019-08-10 MED ORDER — METHIMAZOLE 5 MG PO TABS: 5.0000 mg | ORAL_TABLET | Freq: Every day | ORAL | 6 refills | Status: DC

## 2019-08-10 NOTE — Telephone Encounter (Signed)
Patient request her doctor push her information through on my chart so her PCP can view

## 2019-08-10 NOTE — Telephone Encounter (Signed)
Discussed lab results as below    Results for Tammy [name], Tammy [name] (MRN [id number]) as of 08/10/2019 12:17  Ref. Range 08/09/2019 08:33  Sodium Latest Ref Range: 135 - 145 mEq/L 140  Potassium Latest Ref Range: 3.5 - 5.1 mEq/L 4.1  Chloride Latest Ref Range: 96 - 112 mEq/L 107  CO2 Latest Ref Range: 19 - 32 mEq/L 26  Glucose Latest Ref Range: 70 - 99 mg/dL 94  BUN Latest Ref Range: 6 - 23 mg/dL 16  Creatinine Latest Ref Range: 0.40 - 1.20 mg/dL 1.02  Calcium Latest Ref Range: 8.4 - 10.5 mg/dL 9.6  Alkaline Phosphatase Latest Ref Range: 39 - 117 U/L 45  Albumin Latest Ref Range: 3.5 - 5.2 g/dL 4.4  AST Latest Ref Range: 0 - 37 U/L 23  ALT Latest Ref Range: 0 - 35 U/L 17  Total Protein Latest Ref Range: 6.0 - 8.3 g/dL 7.4  Total Bilirubin Latest Ref Range: 0.2 - 1.2 mg/dL 0.5  GFR Latest Ref Range: >60.00 mL/min 65.99  WBC Latest Ref Range: 4.0 - 10.5 K/uL 6.5  RBC Latest Ref Range: 3.87 - 5.11 Mil/uL 3.77 (L)  Hemoglobin Latest Ref Range: 12.0 - 15.0 g/dL 11.7 (L)  HCT Latest Ref Range: 36.0 - 46.0 % 35.4 (L)  MCV Latest Ref Range: 78.0 - 100.0 fl 94.0  MCHC Latest Ref Range: 30.0 - 36.0 g/dL 33.1  RDW Latest Ref Range: 11.5 - 15.5 % 14.7  Platelets Latest Ref Range: 150.0 - 400.0 K/uL 395.0  Neutrophils Latest Ref Range: 43.0 - 77.0 % 62.8  Lymphocytes Latest Ref Range: 12.0 - 46.0 % 27.4  Monocytes Relative Latest Ref Range: 3.0 - 12.0 % 6.9  Eosinophil Latest Ref Range: 0.0 - 5.0 % 2.3  Basophil Latest Ref Range: 0.0 - 3.0 % 0.6  NEUT# Latest Ref Range: 1.4 - 7.7 K/uL 4.1  Lymphocyte # Latest Ref Range: 0.7 - 4.0 K/uL 1.8  Monocyte # Latest Ref Range: 0.1 - 1.0 K/uL 0.4  Eosinophils Absolute Latest Ref Range: 0.0 - 0.7 K/uL 0.2  Basophils Absolute Latest Ref Range: 0.0 - 0.1 K/uL 0.0  DHEA-SO4 Latest Ref Range: 12 - 133 mcg/dL 139 (H)  TSH Latest Ref Range: 0.35 - 4.50 uIU/mL 0.04 (L)  T4,Free(Direct) Latest Ref Range: 0.60 - 1.60 ng/dL 1.23     Discussed worsening TSH,  recommend thionamide therapy  Also discussed improving DHEAS levels   We discussed that Graves' Disease is a result of an autoimmune condition involving the thyroid.    We discussed with pt the benefits of methimazole in the Tx of hyperthyroidism, as well as the possible side effects/complications of anti-thyroid drug Tx (specifically detailing the rare, but serious side effect of agranulocytosis). She was informed of need for regular thyroid function monitoring while on methimazole to ensure appropriate dosage without over-treatment. As well, we discussed the possible side effects of methimazole including the chance of rash, the small chance of liver irritation/juandice and the <=1 in 300-400 chance of sudden onset agranulocytosis.  We discussed importance of going to ED promptly (and stopping methimazole) if shewere to develop significant fever with severe sore throat of other evidence of acute infection.     We extensively discussed the various treatment options for hyperthyroidism and Graves disease including ablation therapy with radioactive iodine versus antithyroid drug treatment versus surgical therapy.  We recommended to the patient that we felt, at this time, that thionamide therapy would be most optimal.  We discussed the various possible benefits versus  side effects of the various therapies.    Medications Methimazole 5 mg daily  TFT's in 8 weeks   Abby Raelyn Mora, MD  [facility]  [facility] [address] [place], [place] [address] Phone: [phone] FAX: [phone]

## 2019-08-13 NOTE — Telephone Encounter (Signed)
Dr. Kelton Pillar is not in the office at this time. PCP should be able to view any blood work that has been completed, whether released to [employer] for pt viewing or not.

## 2019-10-12 ENCOUNTER — Other Ambulatory Visit: Payer: Self-pay

## 2019-10-12 ENCOUNTER — Other Ambulatory Visit (INDEPENDENT_AMBULATORY_CARE_PROVIDER_SITE_OTHER): Payer: BC Managed Care – PPO

## 2019-10-12 ENCOUNTER — Telehealth: Payer: Self-pay | Admitting: Internal Medicine

## 2019-10-12 DIAGNOSIS — E0500 Thyrotoxicosis with diffuse goiter without thyrotoxic crisis or storm: Secondary | ICD-10-CM

## 2019-10-12 LAB — T4, FREE: Free T4: 1.04 ng/dL (ref 0.60–1.60)

## 2019-10-12 LAB — TSH: TSH: 0.11 u[IU]/mL — ABNORMAL LOW (ref 0.35–4.50)

## 2019-10-12 MED ORDER — METHIMAZOLE 5 MG PO TABS: 5.0000 mg | ORAL_TABLET | Freq: Two times a day (BID) | ORAL | 6 refills | Status: DC

## 2019-10-12 NOTE — Telephone Encounter (Signed)
Discussed lab results with pt . TSH improved but not normal yet , will increase methimazole to 2 tabs daily     Results for Tammy, [name] (MRN [id number]) as of 10/12/2019 15:25  Ref. Range 08/09/2019 08:33 10/12/2019 07:55  TSH Latest Ref Range: 0.35 - 4.50 uIU/mL 0.04 (L) 0.11 (L)  T4,Free(Direct) Latest Ref Range: 0.60 - 1.60 ng/dL 1.23 1.04      Methimazole 5 mg, TWO tabs daily     Pt expressed understanding    Abby Nena Jordan, MD  [facility]  [facility] [address] [place], [place] [address] Phone: [phone] FAX: [phone]

## 2019-11-05 ENCOUNTER — Ambulatory Visit
Admission: RE | Admit: 2019-11-05 | Discharge: 2019-11-05 | Disposition: A | Discharge status: Home / Self Care | Payer: BC Managed Care – PPO | Source: Ambulatory Visit | Attending: Gynecology | Admitting: Gynecology

## 2019-11-05 ENCOUNTER — Other Ambulatory Visit: Payer: Self-pay

## 2019-11-05 DIAGNOSIS — Z1231 Encounter for screening mammogram for malignant neoplasm of breast: Secondary | ICD-10-CM

## 2019-11-08 ENCOUNTER — Other Ambulatory Visit: Payer: Self-pay

## 2019-11-08 ENCOUNTER — Other Ambulatory Visit (INDEPENDENT_AMBULATORY_CARE_PROVIDER_SITE_OTHER): Payer: BC Managed Care – PPO

## 2019-11-08 DIAGNOSIS — E0500 Thyrotoxicosis with diffuse goiter without thyrotoxic crisis or storm: Secondary | ICD-10-CM

## 2019-11-08 LAB — TSH: TSH: 0.68 u[IU]/mL (ref 0.35–4.50)

## 2019-11-08 LAB — T4, FREE: Free T4: 0.89 ng/dL (ref 0.60–1.60)

## 2019-12-07 ENCOUNTER — Other Ambulatory Visit: Payer: Self-pay

## 2019-12-10 NOTE — Progress Notes (Signed)
Name: Tammy [name]  MRN/ DOB: [id number], 1956/05/05    Age/ Sex: 64 y.o., female     PCP: Johny Blamer, MD   Reason for Endocrinology Evaluation: Androgenic alopecia/ Graves' Disease     Initial Endocrinology Clinic Visit: 02/05/2019    PATIENT IDENTIFIER: [name] is a 64 y.o., female with a past medical history of seasonal allergies and androgenic alopecia. She has followed with [facility] since 02/05/2019 for consultative assistance with management of her androgenic alopecia.   HISTORICAL SUMMARY: The patient was referred to [place] by dermatology for the possibility of androgenic alopecia after she was found to have an elevated DHEAS of 190 (reference 12-133 mcg/dL) , with normal testosterone profile (total and free)   Pt noted excessive hair loss for ~ 4 -5 months prior to her presentation. She also noted pruritus at the area of concern and has been scratching her scalp. During dermatology visit she was noted to have hair thinning over the left vertex scalp.    She has been on some form of contraception most of her life, no prior hx of irregular period. No difficulty conceiving in the past.   Found to have graves disease with elevated TRAb and low TSH (0.14 uIu/mL ) but normal FT4 in 04/2019  SUBJECTIVE:   During last visit (05/14/2019): DHEAS levels trending down . Found to have subclinical hyperthyroidism and positive TRAb  Today (12/11/2019):  Tammy [name] is here for a follow up on androgenic alopecia ,elevated DHEAS and Graves' Disease.   Her weight has increased.  She denies excessive hair hirsutism , has noted increased hair growth    Has chronic constipation  Denies palpitations   Denies anxiety or jittery sensation  Continues to take Biotin - but has held it two days ago      ROS:  As per HPI.   HISTORY:  Past Medical History:  Past Medical History:  Diagnosis Date  . Allergy     Past Surgical History: No past surgical  history on file.  Social History:  reports that she has never smoked. She has never used smokeless tobacco. No history on file for alcohol and drug. Family History:  Family History  Problem Relation Age of Onset  . Hypertension Mother      HOME MEDICATIONS: Allergies as of 12/11/2019      Reactions   Cortisporin  [bacitra-neomycin-polymyxin-hc]       Medication List       Accurate as of December 11, 2019  7:57 AM. If you have any questions, ask your nurse or doctor.        aspirin EC 81 MG tablet Take 81 mg by mouth daily.   Biotin 00923 MCG Tabs Take by mouth.   calcium carbonate 1500 (600 Ca) MG Tabs tablet Commonly known as: OSCAL Take by mouth 2 (two) times daily with a meal.   CENTRUM ULTRA WOMENS PO Take by mouth.   CLARITIN PO Take by mouth.   COLLAGEN PO Take by mouth.   D3-50 PO Take by mouth.   Diclofenac Sodium 3 % Gel   dicyclomine 20 MG tablet Commonly known as: BENTYL Take 20 mg by mouth every 6 (six) hours.   estradiol 0.05 MG/24HR patch Commonly known as: VIVELLE-DOT estradiol 0.05 mg/24 hr semiweekly transdermal patch   fluocinonide 0.05 % external solution Commonly known as: LIDEX APPLY ON THE SKIN AS DIRECTED AS NEEDED FOR ITCH   fluticasone 50 MCG/ACT nasal spray Commonly known  as: FLONASE fluticasone propionate 50 mcg/actuation nasal spray,suspension   indapamide 1.25 MG tablet Commonly known as: LOZOL   IRON PO Take 65 mg by mouth.   meloxicam 15 MG tablet Commonly known as: MOBIC Take 15 mg by mouth daily as needed.   methimazole 5 MG tablet Commonly known as: TAPAZOLE Take 1 tablet (5 mg total) by mouth 2 (two) times daily.   olopatadine 0.1 % ophthalmic solution Commonly known as: PATANOL   progesterone 200 MG capsule Commonly known as: PROMETRIUM TAKE 1 CAPSULE BY MOUTH AT BEDTIME FOR 14 DAYS*EVERY 3 MONTHS   topiramate 25 MG tablet Commonly known as: TOPAMAX   VITAMINS FOR THE HAIR PO Take by mouth.          OBJECTIVE:   PHYSICAL EXAM: VS: BP 138/82 (BP Location: Left Arm, Patient Position: Sitting, Cuff Size: Normal)   Pulse 79   Temp 98.2 F (36.8 C)   Ht 5\' 1"  (1.549 m)   Wt 135 lb 9.6 oz (61.5 kg)   LMP 07/26/2011   SpO2 98%   BMI 25.62 kg/m    EXAM: General: Pt appears well and is in NAD  Neck: General: Supple without adenopathy. Thyroid: Thyroid size is prominent ~40 grams on the right.  Questionable right lobe nodule  Lungs: Clear with good BS bilat with no rales, rhonchi, or wheezes  Heart: Auscultation: RRR.  Abdomen: Normoactive bowel sounds, soft, nontender, without masses or organomegaly palpable  Extremities:  BL LE: No pretibial edema normal ROM and strength.  Mental Status: Judgment, insight: Intact Orientation: Oriented to time, place, and person Mood and affect: No depression, anxiety, or agitation     DATA REVIEWED: Results for Tammy [name], Tammy [name] (MRN [id number]) as of 12/11/2019 14:35  Ref. Range 11/08/2019 08:12 12/11/2019 08:12  TSH Latest Ref Range: 0.35 - 4.50 uIU/mL 0.68 3.49  T4,Free(Direct) Latest Ref Range: 0.60 - 1.60 ng/dL 0.89 0.73   01/09/2019  Total testosterone 18 (2-45) ng/dL  Free T 1.3 (0.2-5.0 ) pg/mL  DHEAS 190 (12-133) mcg/dL     Results for Tammy [name], Tammy [name] (MRN [id number]) as of 05/18/2019 15:38  Ref. Range 05/14/2019 15:50  TRAB Latest Ref Range: <=2.00 IU/L 4.49 (H)    ASSESSMENT / PLAN / RECOMMENDATIONS:   1) Subclinical Hyperthyroidism Secondary to Graves' Disease  - Clinically she is euthyroid  - No local neck symptoms.  - TRAb elevated  - Currently on methimazole 5 mg , 2 tabs daily  - Will proceed with thyroid ultrasound due to questionable right thyroid nodule on exam today   Medication:  Reduce methimazole 5 mg, to 1 tablet daily  2) Elevated DHEAS:   - Since normalizing her thyroid , her hair growth has improved.  - We discussed that her levels are minimally elevated and in review of the literature,  these levels have no clinical significance.  - Testosterone levels have been normal.      F/u in 4 months   Labs in 8 weeks    Signed electronically by: Mack Guise, MD  [facility]  [facility] [address] [place], [place] [address] Phone: [phone] FAX: [phone]      CC: Shirline Frees, [place] [address] Phone: [phone]  Fax: [phone]   Return to Endocrinology clinic as below: No future appointments.

## 2019-12-11 ENCOUNTER — Other Ambulatory Visit: Payer: Self-pay

## 2019-12-11 ENCOUNTER — Ambulatory Visit: Payer: BC Managed Care – PPO | Admitting: Internal Medicine

## 2019-12-11 ENCOUNTER — Encounter: Payer: Self-pay | Admitting: Internal Medicine

## 2019-12-11 VITALS — BP 138/82 | HR 79 | Temp 98.2°F | Ht 61.0 in | Wt 135.6 lb

## 2019-12-11 DIAGNOSIS — E0500 Thyrotoxicosis with diffuse goiter without thyrotoxic crisis or storm: Secondary | ICD-10-CM

## 2019-12-11 DIAGNOSIS — E0590 Thyrotoxicosis, unspecified without thyrotoxic crisis or storm: Secondary | ICD-10-CM

## 2019-12-11 LAB — T4, FREE: Free T4: 0.73 ng/dL (ref 0.60–1.60)

## 2019-12-11 LAB — TSH: TSH: 3.49 u[IU]/mL (ref 0.35–4.50)

## 2019-12-11 MED ORDER — METHIMAZOLE 5 MG PO TABS: 5.0000 mg | ORAL_TABLET | Freq: Every day | ORAL | 6 refills | Status: DC

## 2019-12-11 NOTE — Patient Instructions (Signed)
-   Continue methimazole 2 tablets until you hear otherwise from [place]   - I have ordered a thyroid ultrasound

## 2019-12-17 ENCOUNTER — Other Ambulatory Visit: Payer: Self-pay

## 2019-12-17 ENCOUNTER — Ambulatory Visit
Admission: RE | Admit: 2019-12-17 | Discharge: 2019-12-17 | Disposition: A | Discharge status: Home / Self Care | Payer: BC Managed Care – PPO | Source: Ambulatory Visit | Attending: Internal Medicine | Admitting: Internal Medicine

## 2019-12-17 DIAGNOSIS — E0590 Thyrotoxicosis, unspecified without thyrotoxic crisis or storm: Secondary | ICD-10-CM

## 2019-12-19 ENCOUNTER — Telehealth: Payer: Self-pay | Admitting: Internal Medicine

## 2019-12-19 NOTE — Telephone Encounter (Signed)
Unless it is the referring office I would provide pt with # to medical records, only because a release of information usually has to be signed or in pt chart giving [place] permission to release her medical records to another office is how I thought it went. I will check to be sure.

## 2019-12-19 NOTE — Telephone Encounter (Signed)
Patient called requesting that we send most recent chart notes to her OB/GYN, do I need to refer her to medical record or can you do that? In case we can do that - OB/GYN is [facility] Dr Clarene Critchley [place]  Fax# [phone] Ph# [phone]

## 2020-01-04 ENCOUNTER — Ambulatory Visit: Payer: Self-pay | Attending: Internal Medicine

## 2020-01-04 DIAGNOSIS — Z23 Encounter for immunization: Secondary | ICD-10-CM

## 2020-01-04 NOTE — Progress Notes (Signed)
   Covid-19 Vaccination Clinic  Name:  Tammy [name]    MRN: [id number] DOB: January 20, 1956  01/04/2020  [name] was observed post Covid-19 immunization for 15 minutes without incident. She was provided with Vaccine Information Sheet and instruction to access the V-Safe system.   [name] was instructed to call 911 with any severe reactions post vaccine: Marland Kitchen Difficulty breathing  . Swelling of face and throat  . A fast heartbeat  . A bad rash all over body  . Dizziness and weakness   Immunizations Administered    Name Date Dose VIS Date Route   Moderna COVID-19 Vaccine 01/04/2020  9:33 AM 0.5 mL 09/25/2019 Intramuscular   Manufacturer: Moderna   Lot: [id number]   NDC: 67591-638-46

## 2020-02-05 ENCOUNTER — Other Ambulatory Visit: Payer: BC Managed Care – PPO

## 2020-02-05 ENCOUNTER — Ambulatory Visit: Payer: BC Managed Care – PPO | Attending: Internal Medicine

## 2020-02-05 DIAGNOSIS — Z23 Encounter for immunization: Secondary | ICD-10-CM

## 2020-02-05 NOTE — Progress Notes (Signed)
   Covid-19 Vaccination Clinic  Name:  Tammy [name]    MRN: [id number] DOB: [birth year]  02/05/2020  Tammy [name] was observed post Covid-19 immunization for 30 minutes based on pre-vaccination screening without incident. She was provided with Vaccine Information Sheet and instruction to access the V-Safe system.   Tammy [name] was instructed to call 911 with any severe reactions post vaccine: Marland Kitchen Difficulty breathing  . Swelling of face and throat  . A fast heartbeat  . A bad rash all over body  . Dizziness and weakness   Immunizations Administered    Name Date Dose VIS Date Route   Moderna COVID-19 Vaccine 02/05/2020  8:59 AM 0.5 mL 09/25/2019 Intramuscular   Manufacturer: Moderna   Lot: [id number]   NDC: 52479-980-01

## 2020-02-12 ENCOUNTER — Other Ambulatory Visit: Payer: Self-pay

## 2020-02-12 ENCOUNTER — Other Ambulatory Visit (INDEPENDENT_AMBULATORY_CARE_PROVIDER_SITE_OTHER): Payer: BC Managed Care – PPO

## 2020-02-12 DIAGNOSIS — E0500 Thyrotoxicosis with diffuse goiter without thyrotoxic crisis or storm: Secondary | ICD-10-CM

## 2020-02-12 LAB — T4, FREE: Free T4: 0.78 ng/dL (ref 0.60–1.60)

## 2020-02-12 LAB — TSH: TSH: 4.12 u[IU]/mL (ref 0.35–4.50)

## 2020-04-09 NOTE — Progress Notes (Signed)
Name: Tammy [name]  MRN/ DOB: [id number], 11/30/1955    Age/ Sex: 64 y.o., female     PCP: Johny Blamer, MD   Reason for Endocrinology Evaluation: Androgenic alopecia/ Graves' Disease     Initial Endocrinology Clinic Visit: 02/05/2019    PATIENT IDENTIFIER: [name] is a 64 y.o., female with a past medical history of seasonal allergies and androgenic alopecia. She has followed with [facility] since 02/05/2019 for consultative assistance with management of her androgenic alopecia.   HISTORICAL SUMMARY: The patient was referred to [place] by dermatology for the possibility of androgenic alopecia after she was found to have an elevated DHEAS of 190 (reference 12-133 mcg/dL) , with normal testosterone profile (total and free)   Pt noted excessive hair loss for ~ 4 -5 months prior to her presentation. She also noted pruritus at the area of concern and has been scratching her scalp. During dermatology visit she was noted to have hair thinning over the left vertex scalp.    She has been on some form of contraception most of her life, no prior hx of irregular period. No difficulty conceiving in the past.   Found to have graves disease with elevated TRAb and low TSH (0.14 uIu/mL ) but normal FT4 in 04/2019 and was started on methimazole.    SUBJECTIVE:   Today (04/10/2020):  [name] is here for a follow up on androgenic alopecia ,elevated DHEAS and Graves' Disease.  Hair growth is improving   She denies excessive hair hirsutism , has noted increased hair growth    Has chronic constipation  Denies palpitations   Denies anxiety or jittery sensation  Continues to take Biotin - but has held it two days ago      ROS:  As per HPI.   HISTORY:  Past Medical History:  Past Medical History:  Diagnosis Date  . Allergy    Past Surgical History: No past surgical history on file. Social History:  reports that she has never smoked. She has never used  smokeless tobacco. No history on file for alcohol use and drug use. Family History:  Family History  Problem Relation Age of Onset  . Hypertension Mother      HOME MEDICATIONS: Allergies as of 04/10/2020      Reactions   Cortisporin  [bacitra-neomycin-polymyxin-hc]       Medication List       Accurate as of April 10, 2020  7:23 AM. If you have any questions, ask your nurse or doctor.        aspirin EC 81 MG tablet Take 81 mg by mouth daily.   Biotin 73710 MCG Tabs Take by mouth.   calcium carbonate 1500 (600 Ca) MG Tabs tablet Commonly known as: OSCAL Take by mouth 2 (two) times daily with a meal.   CENTRUM ULTRA WOMENS PO Take by mouth.   CLARITIN PO Take by mouth.   COLLAGEN PO Take by mouth.   D3-50 PO Take by mouth.   Diclofenac Sodium 3 % Gel   dicyclomine 20 MG tablet Commonly known as: BENTYL Take 20 mg by mouth every 6 (six) hours.   estradiol 0.05 MG/24HR patch Commonly known as: VIVELLE-DOT estradiol 0.05 mg/24 hr semiweekly transdermal patch   fluocinonide 0.05 % external solution Commonly known as: LIDEX APPLY ON THE SKIN AS DIRECTED AS NEEDED FOR ITCH   fluticasone 50 MCG/ACT nasal spray Commonly known as: FLONASE fluticasone propionate 50 mcg/actuation nasal spray,suspension   indapamide  1.25 MG tablet Commonly known as: LOZOL   IRON PO Take 65 mg by mouth.   meloxicam 15 MG tablet Commonly known as: MOBIC Take 15 mg by mouth daily as needed.   methimazole 5 MG tablet Commonly known as: TAPAZOLE Take 1 tablet (5 mg total) by mouth daily.   olopatadine 0.1 % ophthalmic solution Commonly known as: PATANOL   progesterone 200 MG capsule Commonly known as: PROMETRIUM TAKE 1 CAPSULE BY MOUTH AT BEDTIME FOR 14 DAYS*EVERY 3 MONTHS   topiramate 25 MG tablet Commonly known as: TOPAMAX   VITAMINS FOR THE HAIR PO Take by mouth.         OBJECTIVE:   PHYSICAL EXAM: VS: LMP 07/26/2011    EXAM: General: Pt appears well  and is in NAD  Neck: General: Supple without adenopathy. Thyroid: Thyroid size is prominent ~40 grams on the right.  Questionable right lobe nodule  Lungs: Clear with good BS bilat with no rales, rhonchi, or wheezes  Heart: Auscultation: RRR.  Abdomen: Normoactive bowel sounds, soft, nontender, without masses or organomegaly palpable  Extremities:  BL LE: No pretibial edema normal ROM and strength.  Mental Status: Judgment, insight: Intact Orientation: Oriented to time, place, and person Mood and affect: No depression, anxiety, or agitation     DATA REVIEWED: Results for RHYLI, DEPAULA (MRN [id number]) as of 12/11/2019 14:35  Ref. Range 11/08/2019 08:12 12/11/2019 08:12  TSH Latest Ref Range: 0.35 - 4.50 uIU/mL 0.68 3.49  T4,Free(Direct) Latest Ref Range: 0.60 - 1.60 ng/dL 7.98 9.21   1/94/1740  Total testosterone 18 (2-45) ng/dL  Free T 1.3 (8.1-4.4 ) pg/mL  DHEAS 190 (12-133) mcg/dL     Results for BERNYCE, BRIMLEY (MRN [id number]) as of 05/18/2019 15:38  Ref. Range 05/14/2019 15:50  TRAB Latest Ref Range: <=2.00 IU/L 4.49 (H)   Thyroid Ultrasound 12/17/2019   Estimated total number of nodules >/= 1 cm: 5  Number of spongiform nodules >/=  2 cm not described below (TR1): 0  Number of mixed cystic and solid nodules >/= 1.5 cm not described below (TR2): 0  _________________________________________________________  Nodule # 1:  Location: Isthmus;  Maximum size: 1 cm; Other 2 dimensions: 0.8 x 0.7 cm  Composition: mixed cystic and solid (1)  Echogenicity: hypoechoic (2)  Shape: not taller-than-wide (0)  Margins: ill-defined (0)  Echogenic foci: none (0)  ACR TI-RADS total points: 3.  ACR TI-RADS risk category: TR3 (3 points).  ACR TI-RADS recommendations:  Given size (<1.4 cm) and appearance, this nodule does NOT meet TI-RADS criteria for biopsy or dedicated follow-up.  _________________________________________________________  Nodule #  2:  Location: Isthmus; left of midline  Maximum size: 1.2 cm; Other 2 dimensions: 1 x 0.7 cm  Composition: solid/almost completely solid (2)  Echogenicity: isoechoic (1)  Shape: not taller-than-wide (0)  Margins: ill-defined (0)  Echogenic foci: none (0)  ACR TI-RADS total points: 3.  ACR TI-RADS risk category: TR3 (3 points).  ACR TI-RADS recommendations:  Given size (<1.4 cm) and appearance, this nodule does NOT meet TI-RADS criteria for biopsy or dedicated follow-up.  _________________________________________________________  Nodule # 3:  Location: Right; Mid posterior  Maximum size: 1 cm; Other 2 dimensions: 1 x 0.9 cm  Composition: cannot determine (2)  Echogenicity: hypoechoic (2)  Shape: not taller-than-wide (0)  Margins: ill-defined (0)  Echogenic foci: none (0)  ACR TI-RADS total points: 4.  ACR TI-RADS risk category: TR4 (4-6 points).  ACR TI-RADS recommendations:  *Given size (>/= 1 - 1.4  cm) and appearance, a follow-up ultrasound in 1 year should be considered based on TI-RADS criteria.  _________________________________________________________  Nodule # 4: 0.7 cm hypoechoic nodule without calcifications, mid right; This nodule does NOT meet TI-RADS criteria for biopsy or dedicated follow-up.  Nodule # 5: 0.9 cm isoechoic nodule without calcifications, inferior right; This nodule does NOT meet TI-RADS criteria for biopsy or dedicated follow-up.  Nodule # 6: 0.8 cm spongiform nodule without calcifications, superior left; This nodule does NOT meet TI-RADS criteria for biopsy or dedicated follow-up.  Nodule # 7:  Location: Left; Superior  Maximum size: 1.2 cm; Other 2 dimensions: 0.9 x 0.7 cm  Composition: mixed cystic and solid (1)  Echogenicity: hypoechoic (2)  Shape: not taller-than-wide (0)  Margins: smooth (0)  Echogenic foci: none (0)  ACR TI-RADS total points: 3.  ACR TI-RADS risk  category: TR3 (3 points).  ACR TI-RADS recommendations:  Given size (<1.4 cm) and appearance, this nodule does NOT meet TI-RADS criteria for biopsy or dedicated follow-up.  _________________________________________________________  Nodule # 8:  Location: Left; Inferior  Maximum size: 1.2 cm; Other 2 dimensions: 1.1 x 0.9 cm  Composition: solid/almost completely solid (2)  Echogenicity: isoechoic (1)  Shape: not taller-than-wide (0)  Margins: ill-defined (0)  Echogenic foci: none (0)  ACR TI-RADS total points: 3.  ACR TI-RADS risk category: TR3 (3 points).  ACR TI-RADS recommendations:  Given size (<1.4 cm) and appearance, this nodule does NOT meet TI-RADS criteria for biopsy or dedicated follow-up.  IMPRESSION: 1. Mild thyromegaly with bilateral nodules. None meets criteria for biopsy. 2. Recommend annual/biennial ultrasound follow-up of mid right nodule as above, until stability x5 years confirmed. ASSESSMENT / PLAN / RECOMMENDATIONS:   1) Subclinical Hyperthyroidism Secondary to Graves' Disease  - Clinically she is euthyroid  - No local neck symptoms.  - TRAb elevated   - Repeat TFT's are normal    Medication:  Continue  methimazole 5 mg, Half a tablet daily    2) Multinodular Goiter:  - None meets criteria for FNA based on thyroid ultrasound from 11/2019 - No local neck symptoms - Will repeat US in 11/2020   3) Elevated DHEAS:   - Since normalizing her thyroid , her hair growth has improved.  - We discussed that her levels are minimally elevated and in review of the literature, these levels have no clinical significance.  - Testosterone levels have been normal.      F/u in 4 months  Labs in 8 weeks     Signed electronically by: Mack Guise, MD  [facility]  [facility] [address] [place],  [address] Phone: [phone] FAX: [phone]      CC: Shirline Frees, MD [place] [address] Phone: [phone]  Fax: [phone]   Return to Endocrinology clinic as below: Future Appointments  Date Time Provider [place]  04/10/2020  7:30 AM Havannah Streat, Melanie Crazier, MD [facility] None

## 2020-04-10 ENCOUNTER — Ambulatory Visit: Payer: BC Managed Care – PPO | Admitting: Internal Medicine

## 2020-04-10 ENCOUNTER — Other Ambulatory Visit: Payer: Self-pay

## 2020-04-10 ENCOUNTER — Encounter: Payer: Self-pay | Admitting: Internal Medicine

## 2020-04-10 VITALS — BP 122/82 | HR 71 | Ht 61.0 in | Wt 136.0 lb

## 2020-04-10 DIAGNOSIS — E042 Nontoxic multinodular goiter: Secondary | ICD-10-CM | POA: Diagnosis not present

## 2020-04-10 DIAGNOSIS — E0500 Thyrotoxicosis with diffuse goiter without thyrotoxic crisis or storm: Secondary | ICD-10-CM

## 2020-04-10 DIAGNOSIS — L649 Androgenic alopecia, unspecified: Secondary | ICD-10-CM

## 2020-04-10 LAB — T4, FREE: Free T4: 1.04 ng/dL (ref 0.60–1.60)

## 2020-04-10 LAB — TSH: TSH: 2.88 u[IU]/mL (ref 0.35–4.50)

## 2020-04-10 NOTE — Patient Instructions (Signed)
-   Continue Methimazole 5 mg, Half a tablet daily  

## 2020-04-11 LAB — DHEA-SULFATE: DHEA-SO4: 153 ug/dL — ABNORMAL HIGH (ref 12–133)

## 2020-04-14 DIAGNOSIS — E042 Nontoxic multinodular goiter: Secondary | ICD-10-CM | POA: Insufficient documentation

## 2020-06-11 ENCOUNTER — Other Ambulatory Visit: Payer: BC Managed Care – PPO

## 2020-06-12 ENCOUNTER — Other Ambulatory Visit (INDEPENDENT_AMBULATORY_CARE_PROVIDER_SITE_OTHER): Payer: Medicare PPO

## 2020-06-12 ENCOUNTER — Other Ambulatory Visit: Payer: Self-pay

## 2020-06-12 DIAGNOSIS — E0500 Thyrotoxicosis with diffuse goiter without thyrotoxic crisis or storm: Secondary | ICD-10-CM

## 2020-06-12 LAB — TSH: TSH: 1.66 u[IU]/mL (ref 0.35–4.50)

## 2020-06-12 LAB — T4, FREE: Free T4: 0.89 ng/dL (ref 0.60–1.60)

## 2020-08-11 ENCOUNTER — Encounter: Payer: Self-pay | Admitting: Internal Medicine

## 2020-08-11 ENCOUNTER — Other Ambulatory Visit: Payer: Self-pay

## 2020-08-11 ENCOUNTER — Ambulatory Visit: Payer: Medicare PPO | Admitting: Internal Medicine

## 2020-08-11 VITALS — BP 118/80 | HR 82 | Temp 97.2°F | Ht 61.0 in | Wt 128.5 lb

## 2020-08-11 DIAGNOSIS — E0500 Thyrotoxicosis with diffuse goiter without thyrotoxic crisis or storm: Secondary | ICD-10-CM | POA: Diagnosis not present

## 2020-08-11 DIAGNOSIS — R7989 Other specified abnormal findings of blood chemistry: Secondary | ICD-10-CM

## 2020-08-11 DIAGNOSIS — E042 Nontoxic multinodular goiter: Secondary | ICD-10-CM

## 2020-08-11 DIAGNOSIS — E278 Other specified disorders of adrenal gland: Secondary | ICD-10-CM | POA: Diagnosis not present

## 2020-08-11 LAB — COMPREHENSIVE METABOLIC PANEL
ALT: 15 U/L (ref 0–35)
AST: 22 U/L (ref 0–37)
Albumin: 4.5 g/dL (ref 3.5–5.2)
Alkaline Phosphatase: 56 U/L (ref 39–117)
BUN: 22 mg/dL (ref 6–23)
CO2: 30 mEq/L (ref 19–32)
Calcium: 9.9 mg/dL (ref 8.4–10.5)
Chloride: 102 mEq/L (ref 96–112)
Creatinine, Ser: 1.21 mg/dL — ABNORMAL HIGH (ref 0.40–1.20)
GFR: 46.87 mL/min — ABNORMAL LOW (ref >60.00)
Glucose, Bld: 94 mg/dL (ref 70–99)
Potassium: 3.7 mEq/L (ref 3.5–5.1)
Sodium: 140 mEq/L (ref 135–145)
Total Bilirubin: 0.5 mg/dL (ref 0.2–1.2)
Total Protein: 7.7 g/dL (ref 6.0–8.3)

## 2020-08-11 LAB — CBC WITH DIFFERENTIAL/PLATELET
Basophils Absolute: 0 10*3/uL (ref 0.0–0.1)
Basophils Relative: 0.6 % (ref 0.0–3.0)
Eosinophils Absolute: 0.2 10*3/uL (ref 0.0–0.7)
Eosinophils Relative: 2.8 % (ref 0.0–5.0)
HCT: 36.4 % (ref 36.0–46.0)
Hemoglobin: 12.4 g/dL (ref 12.0–15.0)
Lymphocytes Relative: 29.2 % (ref 12.0–46.0)
Lymphs Abs: 1.6 10*3/uL (ref 0.7–4.0)
MCHC: 34.1 g/dL (ref 30.0–36.0)
MCV: 93.8 fl (ref 78.0–100.0)
Monocytes Absolute: 0.3 10*3/uL (ref 0.1–1.0)
Monocytes Relative: 5.5 % (ref 3.0–12.0)
Neutro Abs: 3.4 10*3/uL (ref 1.4–7.7)
Neutrophils Relative %: 61.9 % (ref 43.0–77.0)
Platelets: 399 10*3/uL (ref 150.0–400.0)
RBC: 3.88 Mil/uL (ref 3.87–5.11)
RDW: 14.5 % (ref 11.5–15.5)
WBC: 5.5 10*3/uL (ref 4.0–10.5)

## 2020-08-11 LAB — T4, FREE: Free T4: 0.7 ng/dL (ref 0.60–1.60)

## 2020-08-11 LAB — TSH: TSH: 1.86 u[IU]/mL (ref 0.35–4.50)

## 2020-08-11 NOTE — Patient Instructions (Signed)
-   Continue Methimazole 5 mg, Half a tablet daily  

## 2020-08-11 NOTE — Progress Notes (Signed)
Name: Tammy [name]  MRN/ DOB: [id number], 01/11/1956    Age/ Sex: 64 y.o., female     PCP: Tammy Blamer, MD   Reason for Endocrinology Evaluation: Androgenic alopecia/ Graves' Disease     Initial Endocrinology Clinic Visit: 02/05/2019    PATIENT IDENTIFIER: [name] is a 64 y.o., female with a past medical history of seasonal allergies and androgenic alopecia. She has followed with [facility] since 02/05/2019 for consultative assistance with management of her androgenic alopecia.   HISTORICAL SUMMARY: The patient was referred to [place] by dermatology for the possibility of androgenic alopecia after she was found to have an elevated DHEAS of 190 (reference 12-133 mcg/dL) , with normal testosterone profile (total and free)   Pt noted excessive hair loss for ~ 4 -5 months prior to her presentation. She also noted pruritus at the area of concern and has been scratching her scalp. During dermatology visit she was noted to have hair thinning over the left vertex scalp.    She has been on some form of contraception most of her life, no prior hx of irregular period. No difficulty conceiving in the past.   Found to have graves disease with elevated TRAb and low TSH (0.14 uIu/mL ) but normal FT4 in 04/2019 and was started on methimazole.    SUBJECTIVE:   Today (08/11/2020):  [name] is here for a follow up on androgenic alopecia ,elevated DHEAS and Graves' Disease.  Hair growth is improving in the scalp area.  She denies excessive hair hirsutism , has noted increased hair growth    Has chronic constipation  Denies palpitations   Has decreased weight ( intentional)  Denies fever and abdominal pain    Continues to take Biotin - but has held it two days ago      HISTORY:  Past Medical History:  Past Medical History:  Diagnosis Date  . Allergy    Past Surgical History: No past surgical history on file. Social History:  reports that she has never  smoked. She has never used smokeless tobacco. No history on file for alcohol use and drug use. Family History:  Family History  Problem Relation Age of Onset  . Hypertension Mother      HOME MEDICATIONS: Allergies as of 08/11/2020      Reactions   Cortisporin  [bacitra-neomycin-polymyxin-hc]       Medication List       Accurate as of August 11, 2020  7:12 AM. If you have any questions, ask your nurse or doctor.        aspirin EC 81 MG tablet Take 81 mg by mouth daily.   Biotin 40981 MCG Tabs Take by mouth.   calcium carbonate 1500 (600 Ca) MG Tabs tablet Commonly known as: OSCAL Take by mouth 2 (two) times daily with a meal.   CENTRUM ULTRA WOMENS PO Take by mouth.   CLARITIN PO Take by mouth.   COLLAGEN PO Take by mouth.   D3-50 PO Take by mouth.   Diclofenac Sodium 3 % Gel   dicyclomine 20 MG tablet Commonly known as: BENTYL Take 20 mg by mouth every 6 (six) hours.   estradiol 0.05 MG/24HR patch Commonly known as: VIVELLE-DOT estradiol 0.05 mg/24 hr semiweekly transdermal patch   fluocinonide 0.05 % external solution Commonly known as: LIDEX APPLY ON THE SKIN AS DIRECTED AS NEEDED FOR ITCH   fluticasone 50 MCG/ACT nasal spray Commonly known as: FLONASE fluticasone propionate 50 mcg/actuation nasal  spray,suspension   indapamide 1.25 MG tablet Commonly known as: LOZOL   IRON PO Take 65 mg by mouth.   meloxicam 15 MG tablet Commonly known as: MOBIC Take 15 mg by mouth daily as needed.   methimazole 5 MG tablet Commonly known as: TAPAZOLE Take 1 tablet (5 mg total) by mouth daily. What changed: additional instructions   olopatadine 0.1 % ophthalmic solution Commonly known as: PATANOL   progesterone 200 MG capsule Commonly known as: PROMETRIUM TAKE 1 CAPSULE BY MOUTH AT BEDTIME FOR 14 DAYS*EVERY 3 MONTHS   topiramate 25 MG tablet Commonly known as: TOPAMAX   VITAMINS FOR THE HAIR PO Take by mouth.         OBJECTIVE:    PHYSICAL EXAM: VS: LMP 07/26/2011    EXAM: General: Pt appears well and is in NAD  Neck: General: Supple without adenopathy. Thyroid: Thyroid size is prominent ~40 grams on the right.  Questionable right lobe nodule  Lungs: Clear with good BS bilat with no rales, rhonchi, or wheezes  Heart: Auscultation: RRR.  Abdomen: Normoactive bowel sounds, soft, nontender, without masses or organomegaly palpable  Extremities:  BL LE: No pretibial edema normal ROM and strength.  Mental Status: Judgment, insight: Intact Orientation: Oriented to time, place, and person Mood and affect: No depression, anxiety, or agitation     DATA REVIEWED: Results for Tammy [name], Tammy [name] (MRN [id number]) as of 08/11/2020 14:12  Ref. Range 08/11/2020 08:36  Sodium Latest Ref Range: 135 - 145 mEq/L 140  Potassium Latest Ref Range: 3.5 - 5.1 mEq/L 3.7  Chloride Latest Ref Range: 96 - 112 mEq/L 102  CO2 Latest Ref Range: 19 - 32 mEq/L 30  Glucose Latest Ref Range: 70 - 99 mg/dL 94  BUN Latest Ref Range: 6 - 23 mg/dL 22  Creatinine Latest Ref Range: 0.40 - 1.20 mg/dL 4.091.21 (H)  Calcium Latest Ref Range: 8.4 - 10.5 mg/dL 9.9  Alkaline Phosphatase Latest Ref Range: 39 - 117 U/L 56  Albumin Latest Ref Range: 3.5 - 5.2 g/dL 4.5  AST Latest Ref Range: 0 - 37 U/L 22  ALT Latest Ref Range: 0 - 35 U/L 15  Total Protein Latest Ref Range: 6.0 - 8.3 g/dL 7.7  Total Bilirubin Latest Ref Range: 0.2 - 1.2 mg/dL 0.5  GFR Latest Ref Range: >60.00 mL/min 46.87 (L)  WBC Latest Ref Range: 4.0 - 10.5 K/uL 5.5  RBC Latest Ref Range: 3.87 - 5.11 Mil/uL 3.88  Hemoglobin Latest Ref Range: 12.0 - 15.0 g/dL 81.112.4  HCT Latest Ref Range: 36 - 46 % 36.4  MCV Latest Ref Range: 78.0 - 100.0 fl 93.8  MCHC Latest Ref Range: 30.0 - 36.0 g/dL 91.434.1  RDW Latest Ref Range: 11.5 - 15.5 % 14.5  Platelets Latest Ref Range: 150 - 400 K/uL 399.0  Neutrophils Latest Ref Range: 43 - 77 % 61.9  Lymphocytes Latest Ref Range: 12 - 46 % 29.2  Monocytes  Relative Latest Ref Range: 3 - 12 % 5.5  Eosinophil Latest Ref Range: 0 - 5 % 2.8  Basophil Latest Ref Range: 0 - 3 % 0.6  NEUT# Latest Ref Range: 1.4 - 7.7 K/uL 3.4  Lymphocyte # Latest Ref Range: 0.7 - 4.0 K/uL 1.6  Monocyte # Latest Ref Range: 0.1 - 1.0 K/uL 0.3  Eosinophils Absolute Latest Ref Range: 0.0 - 0.7 K/uL 0.2  Basophils Absolute Latest Ref Range: 0.0 - 0.1 K/uL 0.0    01/09/2019  Total testosterone 18 (2-45) ng/dL  Free T  1.3 (0.2-5.0 ) pg/mL  DHEAS 190 (12-133) mcg/dL     Results for Tammy [name], Tammy [name] (MRN [id number]) as of 05/18/2019 15:38  Ref. Range 05/14/2019 15:50  TRAB Latest Ref Range: <=2.00 IU/L 4.49 (H)   Thyroid Ultrasound 12/17/2019   Estimated total number of nodules >/= 1 cm: 5  Number of spongiform nodules >/=  2 cm not described below (TR1): 0  Number of mixed cystic and solid nodules >/= 1.5 cm not described below (TR2): 0  _________________________________________________________  Nodule # 1:  Location: Isthmus;  Maximum size: 1 cm; Other 2 dimensions: 0.8 x 0.7 cm  Composition: mixed cystic and solid (1)  Echogenicity: hypoechoic (2)  Shape: not taller-than-wide (0)  Margins: ill-defined (0)  Echogenic foci: none (0)  ACR TI-RADS total points: 3.  ACR TI-RADS risk category: TR3 (3 points).  ACR TI-RADS recommendations:  Given size (<1.4 cm) and appearance, this nodule does NOT meet TI-RADS criteria for biopsy or dedicated follow-up.  _________________________________________________________  Nodule # 2:  Location: Isthmus; left of midline  Maximum size: 1.2 cm; Other 2 dimensions: 1 x 0.7 cm  Composition: solid/almost completely solid (2)  Echogenicity: isoechoic (1)  Shape: not taller-than-wide (0)  Margins: ill-defined (0)  Echogenic foci: none (0)  ACR TI-RADS total points: 3.  ACR TI-RADS risk category: TR3 (3 points).  ACR TI-RADS recommendations:  Given size (<1.4 cm) and  appearance, this nodule does NOT meet TI-RADS criteria for biopsy or dedicated follow-up.  _________________________________________________________  Nodule # 3:  Location: Right; Mid posterior  Maximum size: 1 cm; Other 2 dimensions: 1 x 0.9 cm  Composition: cannot determine (2)  Echogenicity: hypoechoic (2)  Shape: not taller-than-wide (0)  Margins: ill-defined (0)  Echogenic foci: none (0)  ACR TI-RADS total points: 4.  ACR TI-RADS risk category: TR4 (4-6 points).  ACR TI-RADS recommendations:  *Given size (>/= 1 - 1.4 cm) and appearance, a follow-up ultrasound in 1 year should be considered based on TI-RADS criteria.  _________________________________________________________  Nodule # 4: 0.7 cm hypoechoic nodule without calcifications, mid right; This nodule does NOT meet TI-RADS criteria for biopsy or dedicated follow-up.  Nodule # 5: 0.9 cm isoechoic nodule without calcifications, inferior right; This nodule does NOT meet TI-RADS criteria for biopsy or dedicated follow-up.  Nodule # 6: 0.8 cm spongiform nodule without calcifications, superior left; This nodule does NOT meet TI-RADS criteria for biopsy or dedicated follow-up.  Nodule # 7:  Location: Left; Superior  Maximum size: 1.2 cm; Other 2 dimensions: 0.9 x 0.7 cm  Composition: mixed cystic and solid (1)  Echogenicity: hypoechoic (2)  Shape: not taller-than-wide (0)  Margins: smooth (0)  Echogenic foci: none (0)  ACR TI-RADS total points: 3.  ACR TI-RADS risk category: TR3 (3 points).  ACR TI-RADS recommendations:  Given size (<1.4 cm) and appearance, this nodule does NOT meet TI-RADS criteria for biopsy or dedicated follow-up.  _________________________________________________________  Nodule # 8:  Location: Left; Inferior  Maximum size: 1.2 cm; Other 2 dimensions: 1.1 x 0.9 cm  Composition: solid/almost completely solid (2)  Echogenicity:  isoechoic (1)  Shape: not taller-than-wide (0)  Margins: ill-defined (0)  Echogenic foci: none (0)  ACR TI-RADS total points: 3.  ACR TI-RADS risk category: TR3 (3 points).  ACR TI-RADS recommendations:  Given size (<1.4 cm) and appearance, this nodule does NOT meet TI-RADS criteria for biopsy or dedicated follow-up.  IMPRESSION: 1. Mild thyromegaly with bilateral nodules. None meets criteria for biopsy. 2. Recommend annual/biennial ultrasound follow-up of mid  right nodule as above, until stability x5 years confirmed. ASSESSMENT / PLAN / RECOMMENDATIONS:   1) Subclinical Hyperthyroidism Secondary to Graves' Disease  - Clinically she is euthyroid  - No local neck symptoms.  - Repeat TFT's are normal    Medication:  Continue  methimazole 5 mg, Half a tablet daily    2) Multinodular Goiter:  - None meets criteria for FNA based on thyroid ultrasound from 11/2019 - No local neck symptoms - Will repeat US in 11/2020    3) Elevated DHEAS:    - Since normalizing her thyroid , her hair growth has improved.  - We discussed that her levels are minimally elevated and in review of the literature, these levels have no clinical significance.  - Testosterone levels have been normal.  - DHEAS-pending      F/u in 4 months      Signed electronically by: Lyndle Herrlich, MD  [facility]  [facility] [address] [place] [place], [place] [address] Phone: [phone] FAX: [phone]      CC: Tammy Blamer, MD [address] [place] [address] Phone: [phone]  Fax: [phone]   Return to Endocrinology clinic as below: Future Appointments  Date Time Provider Department Center  08/11/2020  8:10 AM Constanza Mincy, Konrad Dolores, MD [facility] None

## 2020-08-12 LAB — DHEA-SULFATE: DHEA-SO4: 139 ug/dL — ABNORMAL HIGH (ref 12–133)

## 2020-09-08 ENCOUNTER — Other Ambulatory Visit: Payer: Self-pay | Admitting: Internal Medicine

## 2020-09-22 ENCOUNTER — Other Ambulatory Visit: Payer: Self-pay | Admitting: Gynecology

## 2020-09-22 DIAGNOSIS — Z1231 Encounter for screening mammogram for malignant neoplasm of breast: Secondary | ICD-10-CM

## 2020-11-04 ENCOUNTER — Ambulatory Visit: Payer: Medicare PPO

## 2020-11-13 ENCOUNTER — Ambulatory Visit: Payer: Medicare PPO

## 2020-11-28 ENCOUNTER — Ambulatory Visit: Payer: Medicare PPO

## 2020-12-01 ENCOUNTER — Other Ambulatory Visit: Payer: Self-pay

## 2020-12-01 ENCOUNTER — Ambulatory Visit
Admission: RE | Admit: 2020-12-01 | Discharge: 2020-12-01 | Disposition: A | Discharge status: Home / Self Care | Payer: Medicare PPO | Source: Ambulatory Visit | Attending: Gynecology | Admitting: Gynecology

## 2020-12-01 DIAGNOSIS — Z1231 Encounter for screening mammogram for malignant neoplasm of breast: Secondary | ICD-10-CM

## 2020-12-11 ENCOUNTER — Other Ambulatory Visit: Payer: Self-pay

## 2020-12-15 ENCOUNTER — Other Ambulatory Visit: Payer: Self-pay

## 2020-12-15 ENCOUNTER — Ambulatory Visit: Payer: Medicare PPO | Admitting: Internal Medicine

## 2020-12-15 ENCOUNTER — Encounter: Payer: Self-pay | Admitting: Internal Medicine

## 2020-12-15 VITALS — BP 118/78 | HR 80 | Ht 61.0 in | Wt 131.4 lb

## 2020-12-15 DIAGNOSIS — E042 Nontoxic multinodular goiter: Secondary | ICD-10-CM | POA: Diagnosis not present

## 2020-12-15 DIAGNOSIS — R7989 Other specified abnormal findings of blood chemistry: Secondary | ICD-10-CM

## 2020-12-15 DIAGNOSIS — E0500 Thyrotoxicosis with diffuse goiter without thyrotoxic crisis or storm: Secondary | ICD-10-CM

## 2020-12-15 DIAGNOSIS — E278 Other specified disorders of adrenal gland: Secondary | ICD-10-CM | POA: Diagnosis not present

## 2020-12-15 NOTE — Patient Instructions (Signed)
-   Continue Methimazole 5 mg, Half a tablet daily

## 2020-12-15 NOTE — Progress Notes (Signed)
Name: Tammy [name]  MRN/ DOB: [id number], [birth year]    Age/ Sex: 65 y.o., female     PCP: Johny BlamerHarris, William, MD   Reason for Endocrinology Evaluation: Androgenic alopecia/ Graves' Disease     Initial Endocrinology Clinic Visit: 02/05/2019    PATIENT IDENTIFIER: Tammy [name] is a 65 y.o., female with a past medical history of seasonal allergies and androgenic alopecia. She has followed with [facility] since 02/05/2019 for consultative assistance with management of her androgenic alopecia.   HISTORICAL SUMMARY: The patient was referred to us by dermatology for the possibility of androgenic alopecia after she was found to have an elevated DHEAS of 190 (reference 12-133 mcg/dL) , with normal testosterone profile (total and free)   Pt noted excessive hair loss for ~ 4 -5 months prior to her presentation. She also noted pruritus at the area of concern and has been scratching her scalp. During dermatology visit she was noted to have hair thinning over the left vertex scalp.    She has been on some form of contraception most of her life, no prior hx of irregular period. No difficulty conceiving in the past.   Found to have graves disease with elevated TRAb and low TSH (0.14 uIu/mL ) but normal FT4 in 04/2019 and was started on methimazole.    SUBJECTIVE:   Today (12/15/2020):  Tammy [name] is here for a follow up on androgenic alopecia ,elevated DHEAS and Graves' Disease.  Hair growth is improving in the scalp area.  She denies hirsutism   Weight stable  Has occasional chronic constipation  Denies palpitations   Denies fever and abdominal pain    Continues to take Biotin -did not hold.   Has left arm pain , improving has noted swelling.      HISTORY:  Past Medical History:  Past Medical History:  Diagnosis Date  . Allergy    Past Surgical History: No past surgical history on file. Social History:  reports that she has never smoked. She has never  used smokeless tobacco. No history on file for alcohol use and drug use. Family History:  Family History  Problem Relation Age of Onset  . Hypertension Mother      HOME MEDICATIONS: Allergies as of 12/15/2020      Reactions   Cortisporin  [bacitra-neomycin-polymyxin-hc]       Medication List       Accurate as of December 15, 2020  8:28 AM. If you have any questions, ask your nurse or doctor.        aspirin EC 81 MG tablet Take 81 mg by mouth daily.   Biotin 4098110000 MCG Tabs Take by mouth.   calcium carbonate 1500 (600 Ca) MG Tabs tablet Commonly known as: OSCAL Take by mouth 2 (two) times daily with a meal.   CENTRUM ULTRA WOMENS PO Take by mouth.   CLARITIN PO Take by mouth.   COLLAGEN PO Take by mouth.   D3-50 PO Take by mouth.   Diclofenac Sodium 3 % Gel   dicyclomine 20 MG tablet Commonly known as: BENTYL Take 20 mg by mouth every 6 (six) hours.   estradiol 0.05 MG/24HR patch Commonly known as: VIVELLE-DOT estradiol 0.05 mg/24 hr semiweekly transdermal patch   fluocinonide 0.05 % external solution Commonly known as: LIDEX APPLY 1ML ON THE SKIN AS DIRECTED AS NEEDED FOR ITCH   fluticasone 50 MCG/ACT nasal spray Commonly known as: FLONASE fluticasone propionate 50 mcg/actuation nasal spray,suspension   indapamide 1.25  MG tablet Commonly known as: LOZOL   IRON PO Take 65 mg by mouth.   meloxicam 15 MG tablet Commonly known as: MOBIC Take 15 mg by mouth daily as needed.   methimazole 5 MG tablet Commonly known as: TAPAZOLE Take 0.5 tablets (2.5 mg total) by mouth daily. 1/2 tab daily   olopatadine 0.1 % ophthalmic solution Commonly known as: PATANOL   progesterone 200 MG capsule Commonly known as: PROMETRIUM TAKE 1 CAPSULE BY MOUTH AT BEDTIME FOR 14 DAYS*EVERY 3 MONTHS   topiramate 25 MG tablet Commonly known as: TOPAMAX   VITAMINS FOR THE HAIR PO Take by mouth.         OBJECTIVE:   PHYSICAL EXAM: VS: BP 118/78   Pulse 80    Ht 5\' 1"  (1.549 m)   Wt 131 lb 6 oz (59.6 kg)   LMP 07/26/2011   SpO2 98%   BMI 24.82 kg/m    EXAM: General: Pt appears well and is in NAD  Neck: General: Supple without adenopathy. Thyroid: Thyroid size is prominent , with bilateral nodules appreciated   Lungs: Clear with good BS bilat with no rales, rhonchi, or wheezes  Heart: Auscultation: RRR.  Abdomen: Normoactive bowel sounds, soft, nontender, without masses or organomegaly palpable  Extremities:  BL LE: No pretibial edema normal ROM and strength.  Mental Status: Judgment, insight: Intact Orientation: Oriented to time, place, and person Mood and affect: No depression, anxiety, or agitation     DATA REVIEWED: Results for Tammy [name], Tammy [name] (MRN Tammy Common) as of 08/11/2020 14:12 BUN Latest Ref Range: 6 - 23 mg/dL 22  Creatinine Latest Ref Range: 0.40 - 1.20 mg/dL 08/13/2020 (H)  Calcium Latest Ref Range: 8.4 - 10.5 mg/dL 9.9  Alkaline Phosphatase Latest Ref Range: 39 - 117 U/L 56  Albumin Latest Ref Range: 3.5 - 5.2 g/dL 4.5  AST Latest Ref Range: 0 - 37 U/L 22  ALT Latest Ref Range: 0 - 35 U/L 15  Total Protein Latest Ref Range: 6.0 - 8.3 g/dL 7.7  Total Bilirubin Latest Ref Range: 0.2 - 1.2 mg/dL 0.5  GFR Latest Ref Range: >60.00 mL/min 46.87 (L)  WBC Latest Ref Range: 4.0 - 10.5 K/uL 5.5  RBC Latest Ref Range: 3.87 - 5.11 Mil/uL 3.88  Hemoglobin Latest Ref Range: 12.0 - 15.0 g/dL 1.60  HCT Latest Ref Range: 36 - 46 % 36.4  MCV Latest Ref Range: 78.0 - 100.0 fl 93.8  MCHC Latest Ref Range: 30.0 - 36.0 g/dL 10.9  RDW Latest Ref Range: 11.5 - 15.5 % 14.5  Platelets Latest Ref Range: 150 - 400 K/uL 399.0  Neutrophils Latest Ref Range: 43 - 77 % 61.9  Lymphocytes Latest Ref Range: 12 - 46 % 29.2  Monocytes Relative Latest Ref Range: 3 - 12 % 5.5  Eosinophil Latest Ref Range: 0 - 5 % 2.8  Basophil Latest Ref Range: 0 - 3 % 0.6  NEUT# Latest Ref Range: 1.4 - 7.7 K/uL 3.4  Lymphocyte # Latest Ref Range: 0.7 - 4.0 K/uL 1.6   Monocyte # Latest Ref Range: 0.1 - 1.0 K/uL 0.3  Eosinophils Absolute Latest Ref Range: 0.0 - 0.7 K/uL 0.2  Basophils Absolute Latest Ref Range: 0.0 - 0.1 K/uL 0.0    01/09/2019  Total testosterone 18 (2-45) ng/dL  Free T 1.3 (01/11/2019 ) pg/mL  DHEAS 190 (12-133) mcg/dL     Results for Tammy [name], Tammy [name] (MRN Tammy Common) as of 05/18/2019 15:38  Ref. Range 05/14/2019 15:50  TRAB Latest Ref  Range: <=2.00 IU/L 4.49 (H)   Thyroid Ultrasound 12/17/2019   Estimated total number of nodules >/= 1 cm: 5  Number of spongiform nodules >/=  2 cm not described below (TR1): 0  Number of mixed cystic and solid nodules >/= 1.5 cm not described below (TR2): 0  _________________________________________________________  Nodule # 1:  Location: Isthmus;  Maximum size: 1 cm; Other 2 dimensions: 0.8 x 0.7 cm  Composition: mixed cystic and solid (1)  Echogenicity: hypoechoic (2)  Shape: not taller-than-wide (0)  Margins: ill-defined (0)  Echogenic foci: none (0)  ACR TI-RADS total points: 3.  ACR TI-RADS risk category: TR3 (3 points).  ACR TI-RADS recommendations:  Given size (<1.4 cm) and appearance, this nodule does NOT meet TI-RADS criteria for biopsy or dedicated follow-up.  _________________________________________________________  Nodule # 2:  Location: Isthmus; left of midline  Maximum size: 1.2 cm; Other 2 dimensions: 1 x 0.7 cm  Composition: solid/almost completely solid (2)  Echogenicity: isoechoic (1)  Shape: not taller-than-wide (0)  Margins: ill-defined (0)  Echogenic foci: none (0)  ACR TI-RADS total points: 3.  ACR TI-RADS risk category: TR3 (3 points).  ACR TI-RADS recommendations:  Given size (<1.4 cm) and appearance, this nodule does NOT meet TI-RADS criteria for biopsy or dedicated follow-up.  _________________________________________________________  Nodule # 3:  Location: Right; Mid posterior  Maximum size:  1 cm; Other 2 dimensions: 1 x 0.9 cm  Composition: cannot determine (2)  Echogenicity: hypoechoic (2)  Shape: not taller-than-wide (0)  Margins: ill-defined (0)  Echogenic foci: none (0)  ACR TI-RADS total points: 4.  ACR TI-RADS risk category: TR4 (4-6 points).  ACR TI-RADS recommendations:  *Given size (>/= 1 - 1.4 cm) and appearance, a follow-up ultrasound in 1 year should be considered based on TI-RADS criteria.  _________________________________________________________  Nodule # 4: 0.7 cm hypoechoic nodule without calcifications, mid right; This nodule does NOT meet TI-RADS criteria for biopsy or dedicated follow-up.  Nodule # 5: 0.9 cm isoechoic nodule without calcifications, inferior right; This nodule does NOT meet TI-RADS criteria for biopsy or dedicated follow-up.  Nodule # 6: 0.8 cm spongiform nodule without calcifications, superior left; This nodule does NOT meet TI-RADS criteria for biopsy or dedicated follow-up.  Nodule # 7:  Location: Left; Superior  Maximum size: 1.2 cm; Other 2 dimensions: 0.9 x 0.7 cm  Composition: mixed cystic and solid (1)  Echogenicity: hypoechoic (2)  Shape: not taller-than-wide (0)  Margins: smooth (0)  Echogenic foci: none (0)  ACR TI-RADS total points: 3.  ACR TI-RADS risk category: TR3 (3 points).  ACR TI-RADS recommendations:  Given size (<1.4 cm) and appearance, this nodule does NOT meet TI-RADS criteria for biopsy or dedicated follow-up.  _________________________________________________________  Nodule # 8:  Location: Left; Inferior  Maximum size: 1.2 cm; Other 2 dimensions: 1.1 x 0.9 cm  Composition: solid/almost completely solid (2)  Echogenicity: isoechoic (1)  Shape: not taller-than-wide (0)  Margins: ill-defined (0)  Echogenic foci: none (0)  ACR TI-RADS total points: 3.  ACR TI-RADS risk category: TR3 (3 points).  ACR TI-RADS  recommendations:  Given size (<1.4 cm) and appearance, this nodule does NOT meet TI-RADS criteria for biopsy or dedicated follow-up.  IMPRESSION: 1. Mild thyromegaly with bilateral nodules. None meets criteria for biopsy. 2. Recommend annual/biennial ultrasound follow-up of mid right nodule as above, until stability x5 years confirmed. ASSESSMENT / PLAN / RECOMMENDATIONS:   1) Subclinical Hyperthyroidism Secondary to Graves' Disease  - Clinically she is euthyroid  - No local  neck symptoms.  - She forgot to hold the Biotin, will recheck in 2-3 days    Medication:  Continue  methimazole 5 mg, Half a tablet daily    2) Multinodular Goiter:  - None meets criteria for FNA based on thyroid ultrasound from 11/2019 - No local neck symptoms - Will proceed with thyroid ultrasound     3) Elevated DHEAS:    - Since normalizing her thyroid , her hair growth has improved.  - We discussed that her levels are minimally elevated and in review of the literature, these levels have no clinical significance.  - Testosterone levels have been normal.  - DHEAS-pending  - Will proceed with adrenal imaging      F/u in 6 months      Signed electronically by: Lyndle Herrlich, MD  [facility]  [facility] [address] [place] [place], [place] [address] Phone: [phone] FAX: [phone]      CC: Johny Blamer, MD [address] [place] [address] Phone: [phone]  Fax: [phone]   Return to Endocrinology clinic as below: Future Appointments  Date Time Provider Department Center  12/17/2020 10:15 AM [facility] None  06/17/2021  7:30 AM Armani Brar, Konrad Dolores, MD [facility] None

## 2020-12-16 ENCOUNTER — Ambulatory Visit
Admission: RE | Admit: 2020-12-16 | Discharge: 2020-12-16 | Disposition: A | Discharge status: Home / Self Care | Payer: Medicare PPO | Source: Ambulatory Visit | Attending: Internal Medicine | Admitting: Internal Medicine

## 2020-12-16 DIAGNOSIS — E042 Nontoxic multinodular goiter: Secondary | ICD-10-CM

## 2020-12-17 ENCOUNTER — Other Ambulatory Visit (INDEPENDENT_AMBULATORY_CARE_PROVIDER_SITE_OTHER): Payer: Medicare PPO

## 2020-12-17 ENCOUNTER — Other Ambulatory Visit: Payer: Self-pay

## 2020-12-17 DIAGNOSIS — E278 Other specified disorders of adrenal gland: Secondary | ICD-10-CM

## 2020-12-17 DIAGNOSIS — E0500 Thyrotoxicosis with diffuse goiter without thyrotoxic crisis or storm: Secondary | ICD-10-CM | POA: Diagnosis not present

## 2020-12-17 DIAGNOSIS — R7989 Other specified abnormal findings of blood chemistry: Secondary | ICD-10-CM

## 2020-12-17 LAB — TSH: TSH: 2.63 u[IU]/mL (ref 0.35–4.50)

## 2020-12-17 LAB — COMPREHENSIVE METABOLIC PANEL
ALT: 17 U/L (ref 0–35)
AST: 21 U/L (ref 0–37)
Albumin: 4.3 g/dL (ref 3.5–5.2)
Alkaline Phosphatase: 48 U/L (ref 39–117)
BUN: 17 mg/dL (ref 6–23)
CO2: 31 mEq/L (ref 19–32)
Calcium: 10 mg/dL (ref 8.4–10.5)
Chloride: 102 mEq/L (ref 96–112)
Creatinine, Ser: 1.21 mg/dL — ABNORMAL HIGH (ref 0.40–1.20)
GFR: 47.04 mL/min — ABNORMAL LOW (ref >60.00)
Glucose, Bld: 90 mg/dL (ref 70–99)
Potassium: 3.6 mEq/L (ref 3.5–5.1)
Sodium: 140 mEq/L (ref 135–145)
Total Bilirubin: 0.6 mg/dL (ref 0.2–1.2)
Total Protein: 7.4 g/dL (ref 6.0–8.3)

## 2020-12-17 LAB — T4, FREE: Free T4: 0.94 ng/dL (ref 0.60–1.60)

## 2020-12-17 LAB — DHEA-SULFATE: DHEA-SO4: 143 ug/dL — ABNORMAL HIGH (ref 9–118)

## 2020-12-23 DIAGNOSIS — Z6825 Body mass index (BMI) 25.0-25.9, adult: Secondary | ICD-10-CM | POA: Diagnosis not present

## 2020-12-23 DIAGNOSIS — Z01419 Encounter for gynecological examination (general) (routine) without abnormal findings: Secondary | ICD-10-CM | POA: Diagnosis not present

## 2020-12-23 DIAGNOSIS — Z79890 Hormone replacement therapy (postmenopausal): Secondary | ICD-10-CM | POA: Diagnosis not present

## 2020-12-29 ENCOUNTER — Ambulatory Visit
Admission: RE | Admit: 2020-12-29 | Discharge: 2020-12-29 | Disposition: A | Discharge status: Home / Self Care | Payer: Medicare PPO | Source: Ambulatory Visit | Attending: Internal Medicine | Admitting: Internal Medicine

## 2020-12-29 DIAGNOSIS — Q638 Other specified congenital malformations of kidney: Secondary | ICD-10-CM | POA: Diagnosis not present

## 2020-12-29 DIAGNOSIS — N200 Calculus of kidney: Secondary | ICD-10-CM | POA: Diagnosis not present

## 2020-12-29 DIAGNOSIS — R7989 Other specified abnormal findings of blood chemistry: Secondary | ICD-10-CM

## 2020-12-29 DIAGNOSIS — E278 Other specified disorders of adrenal gland: Secondary | ICD-10-CM

## 2020-12-29 DIAGNOSIS — N281 Cyst of kidney, acquired: Secondary | ICD-10-CM | POA: Diagnosis not present

## 2020-12-29 DIAGNOSIS — J9811 Atelectasis: Secondary | ICD-10-CM | POA: Diagnosis not present

## 2020-12-29 MED ORDER — IOPAMIDOL (ISOVUE-300) INJECTION 61%
100.0000 mL | Freq: Once | INTRAVENOUS | Status: AC | PRN
  Administered 2020-12-29: 100 mL via INTRAVENOUS

## 2020-12-31 ENCOUNTER — Telehealth: Payer: Self-pay | Admitting: Internal Medicine

## 2020-12-31 NOTE — Telephone Encounter (Signed)
Attempted to call the pt on 12/31/2020 the first time phone line cut off and the second time kept ringing    A portal message will be sent    Tammy Raelyn Mora, MD  [facility] Endocrinology  [facility] [address] [place], [place] [address] Phone: [phone] FAX: [phone]

## 2021-01-15 ENCOUNTER — Encounter: Payer: Self-pay | Admitting: Internal Medicine

## 2021-01-28 DIAGNOSIS — H40013 Open angle with borderline findings, low risk, bilateral: Secondary | ICD-10-CM | POA: Diagnosis not present

## 2021-01-28 DIAGNOSIS — H43812 Vitreous degeneration, left eye: Secondary | ICD-10-CM | POA: Diagnosis not present

## 2021-01-28 DIAGNOSIS — H25813 Combined forms of age-related cataract, bilateral: Secondary | ICD-10-CM | POA: Diagnosis not present

## 2021-03-02 DIAGNOSIS — H40013 Open angle with borderline findings, low risk, bilateral: Secondary | ICD-10-CM | POA: Diagnosis not present

## 2021-03-06 ENCOUNTER — Other Ambulatory Visit: Payer: Self-pay | Admitting: Internal Medicine

## 2021-03-19 DIAGNOSIS — R0600 Dyspnea, unspecified: Secondary | ICD-10-CM | POA: Diagnosis not present

## 2021-03-19 DIAGNOSIS — R5383 Other fatigue: Secondary | ICD-10-CM | POA: Diagnosis not present

## 2021-03-19 DIAGNOSIS — R635 Abnormal weight gain: Secondary | ICD-10-CM | POA: Diagnosis not present

## 2021-03-19 DIAGNOSIS — M545 Low back pain, unspecified: Secondary | ICD-10-CM | POA: Diagnosis not present

## 2021-05-19 DIAGNOSIS — K219 Gastro-esophageal reflux disease without esophagitis: Secondary | ICD-10-CM | POA: Diagnosis not present

## 2021-05-19 DIAGNOSIS — I10 Essential (primary) hypertension: Secondary | ICD-10-CM | POA: Diagnosis not present

## 2021-05-19 DIAGNOSIS — K589 Irritable bowel syndrome without diarrhea: Secondary | ICD-10-CM | POA: Diagnosis not present

## 2021-05-19 DIAGNOSIS — M1990 Unspecified osteoarthritis, unspecified site: Secondary | ICD-10-CM | POA: Diagnosis not present

## 2021-05-19 DIAGNOSIS — E7800 Pure hypercholesterolemia, unspecified: Secondary | ICD-10-CM | POA: Diagnosis not present

## 2021-05-19 DIAGNOSIS — G43909 Migraine, unspecified, not intractable, without status migrainosus: Secondary | ICD-10-CM | POA: Diagnosis not present

## 2021-05-19 DIAGNOSIS — J309 Allergic rhinitis, unspecified: Secondary | ICD-10-CM | POA: Diagnosis not present

## 2021-05-19 DIAGNOSIS — Z23 Encounter for immunization: Secondary | ICD-10-CM | POA: Diagnosis not present

## 2021-06-17 ENCOUNTER — Ambulatory Visit: Payer: Medicare PPO | Admitting: Internal Medicine

## 2021-06-17 ENCOUNTER — Other Ambulatory Visit: Payer: Self-pay

## 2021-06-17 VITALS — BP 120/80 | HR 80 | Wt 127.8 lb

## 2021-06-17 DIAGNOSIS — L649 Androgenic alopecia, unspecified: Secondary | ICD-10-CM

## 2021-06-17 DIAGNOSIS — E042 Nontoxic multinodular goiter: Secondary | ICD-10-CM

## 2021-06-17 DIAGNOSIS — N1832 Chronic kidney disease, stage 3b: Secondary | ICD-10-CM | POA: Diagnosis not present

## 2021-06-17 DIAGNOSIS — E0500 Thyrotoxicosis with diffuse goiter without thyrotoxic crisis or storm: Secondary | ICD-10-CM

## 2021-06-17 LAB — CBC WITH DIFFERENTIAL/PLATELET
Basophils Absolute: 0.1 10*3/uL (ref 0.0–0.1)
Basophils Relative: 0.8 % (ref 0.0–3.0)
Eosinophils Absolute: 0.2 10*3/uL (ref 0.0–0.7)
Eosinophils Relative: 3.6 % (ref 0.0–5.0)
HCT: 36.3 % (ref 36.0–46.0)
Hemoglobin: 12.1 g/dL (ref 12.0–15.0)
Lymphocytes Relative: 22.9 % (ref 12.0–46.0)
Lymphs Abs: 1.5 10*3/uL (ref 0.7–4.0)
MCHC: 33.4 g/dL (ref 30.0–36.0)
MCV: 92.2 fl (ref 78.0–100.0)
Monocytes Absolute: 0.4 10*3/uL (ref 0.1–1.0)
Monocytes Relative: 6.5 % (ref 3.0–12.0)
Neutro Abs: 4.4 10*3/uL (ref 1.4–7.7)
Neutrophils Relative %: 66.2 % (ref 43.0–77.0)
Platelets: 395 10*3/uL (ref 150.0–400.0)
RBC: 3.94 Mil/uL (ref 3.87–5.11)
RDW: 14.6 % (ref 11.5–15.5)
WBC: 6.7 10*3/uL (ref 4.0–10.5)

## 2021-06-17 LAB — COMPREHENSIVE METABOLIC PANEL
ALT: 19 U/L (ref 0–35)
AST: 22 U/L (ref 0–37)
Albumin: 4.5 g/dL (ref 3.5–5.2)
Alkaline Phosphatase: 60 U/L (ref 39–117)
BUN: 20 mg/dL (ref 6–23)
CO2: 28 mEq/L (ref 19–32)
Calcium: 10.3 mg/dL (ref 8.4–10.5)
Chloride: 102 mEq/L (ref 96–112)
Creatinine, Ser: 1.33 mg/dL — ABNORMAL HIGH (ref 0.40–1.20)
GFR: 41.85 mL/min — ABNORMAL LOW (ref >60.00)
Glucose, Bld: 84 mg/dL (ref 70–99)
Potassium: 3.9 mEq/L (ref 3.5–5.1)
Sodium: 140 mEq/L (ref 135–145)
Total Bilirubin: 0.6 mg/dL (ref 0.2–1.2)
Total Protein: 7.7 g/dL (ref 6.0–8.3)

## 2021-06-17 LAB — TSH: TSH: 3.16 u[IU]/mL (ref 0.35–5.50)

## 2021-06-17 LAB — T4, FREE: Free T4: 0.84 ng/dL (ref 0.60–1.60)

## 2021-06-17 NOTE — Progress Notes (Signed)
Name: Tammy CommonDeborah B [name]  MRN/ DOB: [id number], 08-24-1956    Age/ Sex: 65 y.o., female     PCP: Tammy BlamerHarris, William, MD   Reason for Endocrinology Evaluation: Androgenic alopecia/ Graves' Disease     Initial Endocrinology Clinic Visit: 02/05/2019    PATIENT IDENTIFIER: [name] is a 65 y.o., female with a past medical history of seasonal allergies and androgenic alopecia. She has followed with [facility] since 02/05/2019 for consultative assistance with management of her androgenic alopecia.   HISTORICAL SUMMARY: The patient was referred to us by dermatology for the possibility of androgenic alopecia after she was found to have an elevated DHEAS of 190 (reference 12-133 mcg/dL) , with normal testosterone profile (total and free)     Pt noted excessive hair loss for ~ 4 -5 months prior to her presentation. She also noted pruritus at the area of concern and has been scratching her scalp. During dermatology visit she was noted to have hair thinning over the left vertex scalp.     She has been on some form of contraception most of her life, no prior hx of irregular period. No difficulty conceiving in the past.   Found to have graves disease with elevated TRAb and low TSH (0.14 uIu/mL ) but normal FT4 in 04/2019 and was started on methimazole.    CT abdomen 12/2020 showed NO evidence of adrenal tumor   SUBJECTIVE:   Today (06/17/2021):  Tammy [name] is here for a follow up on androgenic alopecia ,elevated DHEAS and Graves' Disease.    Hair growth is improving over the scalp area.  Has occasional sporadic thick dark hair over the chin  Has occasional chronic constipation  Denies palpitations  Denies any local neck symptoms    Continues to take Biotin held   Methimazole 5 mg half a tablet daily   Has recent dx Glaucoma     HISTORY:  Past Medical History:  Past Medical History:  Diagnosis Date   Allergy    Past Surgical History: No past surgical history  on file. Social History:  reports that she has never smoked. She has never used smokeless tobacco. No history on file for alcohol use and drug use. Family History:  Family History  Problem Relation Age of Onset   Hypertension Mother      HOME MEDICATIONS: Allergies as of 06/17/2021       Reactions   Cortisporin  [bacitra-neomycin-polymyxin-hc]         Medication List        Accurate as of June 17, 2021  7:05 AM. If you have any questions, ask your nurse or doctor.          aspirin EC 81 MG tablet Take 81 mg by mouth daily.   Biotin [id number] MCG Tabs Take by mouth.   calcium carbonate 1500 (600 Ca) MG Tabs tablet Commonly known as: OSCAL Take by mouth 2 (two) times daily with a meal.   CENTRUM ULTRA WOMENS PO Take by mouth.   CLARITIN PO Take by mouth.   COLLAGEN PO Take by mouth.   D3-50 PO Take by mouth.   Diclofenac Sodium 3 % Gel   dicyclomine 20 MG tablet Commonly known as: BENTYL Take 20 mg by mouth every 6 (six) hours.   estradiol 0.05 MG/24HR patch Commonly known as: VIVELLE-DOT estradiol 0.05 mg/24 hr semiweekly transdermal patch   fluocinonide 0.05 % external solution Commonly known as: LIDEX APPLY 1ML ON THE SKIN AS DIRECTED AS  NEEDED FOR ITCH   fluticasone 50 MCG/ACT nasal spray Commonly known as: FLONASE fluticasone propionate 50 mcg/actuation nasal spray,suspension   indapamide 1.25 MG tablet Commonly known as: LOZOL   IRON PO Take 65 mg by mouth.   meloxicam 15 MG tablet Commonly known as: MOBIC Take 15 mg by mouth daily as needed.   methimazole 5 MG tablet Commonly known as: TAPAZOLE TAKE 1/2 TABLET (2.5 MG TOTAL) BY MOUTH DAILY.   olopatadine 0.1 % ophthalmic solution Commonly known as: PATANOL   progesterone 200 MG capsule Commonly known as: PROMETRIUM TAKE 1 CAPSULE BY MOUTH AT BEDTIME FOR 14 DAYS*EVERY 3 MONTHS   topiramate 25 MG tablet Commonly known as: TOPAMAX   VITAMINS FOR THE HAIR PO Take by mouth.           OBJECTIVE:   PHYSICAL EXAM: VS: BP 120/80   Pulse 80   Wt 127 lb 12.8 oz (58 kg)   LMP 07/26/2011   BMI 24.15 kg/m    EXAM: General: Pt appears well and is in NAD  Neck: General: Supple without adenopathy. Thyroid: Thyroid size is prominent , with bilateral nodules appreciated   Lungs: Clear with good BS bilat with no rales, rhonchi, or wheezes  Heart: Auscultation: RRR.  Abdomen: Normoactive bowel sounds, soft, nontender, without masses or organomegaly palpable  Extremities:  BL LE: No pretibial edema normal ROM and strength.  Mental Status: Judgment, insight: Intact Orientation: Oriented to time, place, and person Mood and affect: No depression, anxiety, or agitation     DATA REVIEWED: Results for Tammy, [name] (MRN [id number]) as of 06/18/2021 07:58  Ref. Range 06/17/2021 07:56  Sodium Latest Ref Range: 135 - 145 mEq/L 140  Potassium Latest Ref Range: 3.5 - 5.1 mEq/L 3.9  Chloride Latest Ref Range: 96 - 112 mEq/L 102  CO2 Latest Ref Range: 19 - 32 mEq/L 28  Glucose Latest Ref Range: 70 - 99 mg/dL 84  BUN Latest Ref Range: 6 - 23 mg/dL 20  Creatinine Latest Ref Range: 0.40 - 1.20 mg/dL 9.37 (H)  Calcium Latest Ref Range: 8.4 - 10.5 mg/dL 16.9  Alkaline Phosphatase Latest Ref Range: 39 - 117 U/L 60  Albumin Latest Ref Range: 3.5 - 5.2 g/dL 4.5  AST Latest Ref Range: 0 - 37 U/L 22  ALT Latest Ref Range: 0 - 35 U/L 19  Total Protein Latest Ref Range: 6.0 - 8.3 g/dL 7.7  Total Bilirubin Latest Ref Range: 0.2 - 1.2 mg/dL 0.6  GFR Latest Ref Range: >60.00 mL/min 41.85 (L)  WBC Latest Ref Range: 4.0 - 10.5 K/uL 6.7  RBC Latest Ref Range: 3.87 - 5.11 Mil/uL 3.94  Hemoglobin Latest Ref Range: 12.0 - 15.0 g/dL 67.8  HCT Latest Ref Range: 36.0 - 46.0 % 36.3  MCV Latest Ref Range: 78.0 - 100.0 fl 92.2  MCHC Latest Ref Range: 30.0 - 36.0 g/dL 93.8  RDW Latest Ref Range: 11.5 - 15.5 % 14.6  Platelets Latest Ref Range: 150.0 - 400.0 K/uL 395.0  Neutrophils Latest  Ref Range: 43.0 - 77.0 % 66.2  Lymphocytes Latest Ref Range: 12.0 - 46.0 % 22.9  Monocytes Relative Latest Ref Range: 3.0 - 12.0 % 6.5  Eosinophil Latest Ref Range: 0.0 - 5.0 % 3.6  Basophil Latest Ref Range: 0.0 - 3.0 % 0.8  NEUT# Latest Ref Range: 1.4 - 7.7 K/uL 4.4  Lymphocyte # Latest Ref Range: 0.7 - 4.0 K/uL 1.5  Monocyte # Latest Ref Range: 0.1 - 1.0 K/uL 0.4  Eosinophils Absolute Latest Ref Range: 0.0 - 0.7 K/uL 0.2  Basophils Absolute Latest Ref Range: 0.0 - 0.1 K/uL 0.1  TSH Latest Ref Range: 0.35 - 5.50 uIU/mL 3.16  T4,Free(Direct) Latest Ref Range: 0.60 - 1.60 ng/dL 1.75    10/26/5850   Total testosterone 18 (2-45) ng/dL  Free T 1.3 (7.7-8.2 ) pg/mL  DHEAS 190 (12-133) mcg/dL     Results for Tammy, [name] (MRN [id number]) as of 05/18/2019 15:38  Ref. Range 05/14/2019 15:50  TRAB Latest Ref Range: <=2.00 IU/L 4.49 (H)   Thyroid Ultrasound 12/16/2020  Estimated total number of nodules >/= 1 cm: 6-10   Number of spongiform nodules >/=  2 cm not described below (TR1): 0   Number of mixed cystic and solid nodules >/= 1.5 cm not described below (TR2): 0   _________________________________________________________   Nodule # 3:   Prior biopsy: No   Location: Right; Mid   Maximum size: 1.0 cm; Other 2 dimensions: 0.9 x 0.9 cm, previously, 1.0 x 1.0 x 0.9 cm   Composition: mixed cystic and solid (1)   Echogenicity: hypoechoic (2)   Shape: not taller-than-wide (0)   Margins: ill-defined (0)   Echogenic foci: none (0)   ACR TI-RADS total points: 3.   ACR TI-RADS risk category:  TR3 (3 points).   Significant change in size (>/= 20% in two dimensions and minimal increase of 2 mm): No   Change in features: Yes   Change in ACR TI-RADS risk category: Yes   ACR TI-RADS recommendations:   Given size (<1.4 cm) and appearance, this nodule does NOT meet TI-RADS criteria for biopsy or dedicated follow-up.   The remaining scattered bilateral thyroid nodules  ranging from 0.9-1.5 cm in maximum diameter are unchanged. There are no new discrete nodules.   _________________________________________________________   IMPRESSION: 1. Similar appearing multinodular goiter. 2. Solid-cystic appearance of the previously visualized right mid thyroid nodule (labeled 3, 1.0 cm) now characterized as TI-RADS category 3. Therefore, no further ultrasound follow-up or tissue sampling is recommended. 3. No new discrete nodules that warrant additional ultrasound follow-up or tissue sampling.    ASSESSMENT / PLAN / RECOMMENDATIONS:   1) Subclinical Hyperthyroidism Secondary to Graves' Disease  - Clinically she is euthyroid  - No local neck symptoms.  - She held Biotin  -TFTs are normal no changes  Medication:  Continue  methimazole 5 mg, Half a tablet daily    2) Multinodular Goiter:  - None meets criteria for FNA based on thyroid ultrasound from 11/2019 - No local neck symptoms -She is up-to-date on thyroid ultrasound will repeat in 2023    3) Elevated DHEAS:    - Since normalizing her thyroid , her hair growth has improved.  - We discussed that her levels are minimally elevated and in review of the literature, these levels have no clinical significance.  I suspect this is hereditary as her sister has overt hirsutism over the beard area - Testosterone levels have been normal.  - DHEAS stable -Adrenal imaging has been - 12/2020   4) CKD III:   -Her GFR has been trending down and creatinine trending up with no apparent reason.  I am going to refer her to nephrology for further evaluation  F/u in 6 months      Signed electronically by: Lyndle Herrlich, MD  [facility] Endocrinology  [facility] [address] [place] [place], [place] [address] Phone: [phone] FAX: [phone]      CC: Tiburcio Pea  Chrissie Noa, MD [phone] [place] [address] Phone: [phone]  Fax:  [phone]   Return to Endocrinology clinic as below: Future Appointments  Date Time Provider Department Center  06/17/2021  7:30 AM Arilyn Brierley, Konrad Dolores, MD [facility] None

## 2021-06-18 ENCOUNTER — Encounter: Payer: Self-pay | Admitting: Internal Medicine

## 2021-06-18 DIAGNOSIS — N1832 Chronic kidney disease, stage 3b: Secondary | ICD-10-CM | POA: Insufficient documentation

## 2021-06-18 MED ORDER — METHIMAZOLE 5 MG PO TABS: 2.5000 mg | ORAL_TABLET | Freq: Every day | ORAL | 1 refills | Status: DC

## 2021-07-02 ENCOUNTER — Encounter: Payer: Self-pay | Admitting: Internal Medicine

## 2021-07-28 DIAGNOSIS — Z111 Encounter for screening for respiratory tuberculosis: Secondary | ICD-10-CM | POA: Diagnosis not present

## 2021-08-14 ENCOUNTER — Telehealth: Payer: Self-pay | Admitting: Internal Medicine

## 2021-08-14 NOTE — Telephone Encounter (Signed)
Pt calling in to inquire about the status of her referral to Nephrology.   Pt contact [phone]

## 2021-08-19 ENCOUNTER — Encounter: Payer: Self-pay | Admitting: Internal Medicine

## 2021-09-01 DIAGNOSIS — H43812 Vitreous degeneration, left eye: Secondary | ICD-10-CM | POA: Diagnosis not present

## 2021-09-01 DIAGNOSIS — H25813 Combined forms of age-related cataract, bilateral: Secondary | ICD-10-CM | POA: Diagnosis not present

## 2021-09-01 DIAGNOSIS — H40013 Open angle with borderline findings, low risk, bilateral: Secondary | ICD-10-CM | POA: Diagnosis not present

## 2021-09-12 ENCOUNTER — Encounter: Payer: Self-pay | Admitting: Internal Medicine

## 2021-09-14 ENCOUNTER — Other Ambulatory Visit: Payer: Self-pay

## 2021-10-23 ENCOUNTER — Other Ambulatory Visit: Payer: Self-pay | Admitting: Gynecology

## 2021-10-23 DIAGNOSIS — Z1231 Encounter for screening mammogram for malignant neoplasm of breast: Secondary | ICD-10-CM

## 2021-12-02 ENCOUNTER — Ambulatory Visit
Admission: RE | Admit: 2021-12-02 | Discharge: 2021-12-02 | Disposition: A | Discharge status: Home / Self Care | Payer: Medicare PPO | Source: Ambulatory Visit | Attending: Gynecology | Admitting: Gynecology

## 2021-12-02 DIAGNOSIS — Z1231 Encounter for screening mammogram for malignant neoplasm of breast: Secondary | ICD-10-CM

## 2021-12-03 DIAGNOSIS — G43909 Migraine, unspecified, not intractable, without status migrainosus: Secondary | ICD-10-CM | POA: Diagnosis not present

## 2021-12-03 DIAGNOSIS — E7800 Pure hypercholesterolemia, unspecified: Secondary | ICD-10-CM | POA: Diagnosis not present

## 2021-12-03 DIAGNOSIS — I10 Essential (primary) hypertension: Secondary | ICD-10-CM | POA: Diagnosis not present

## 2021-12-03 DIAGNOSIS — M1990 Unspecified osteoarthritis, unspecified site: Secondary | ICD-10-CM | POA: Diagnosis not present

## 2021-12-03 DIAGNOSIS — K589 Irritable bowel syndrome without diarrhea: Secondary | ICD-10-CM | POA: Diagnosis not present

## 2021-12-03 DIAGNOSIS — M1712 Unilateral primary osteoarthritis, left knee: Secondary | ICD-10-CM | POA: Diagnosis not present

## 2021-12-03 DIAGNOSIS — E0500 Thyrotoxicosis with diffuse goiter without thyrotoxic crisis or storm: Secondary | ICD-10-CM | POA: Diagnosis not present

## 2021-12-03 DIAGNOSIS — J309 Allergic rhinitis, unspecified: Secondary | ICD-10-CM | POA: Diagnosis not present

## 2021-12-03 DIAGNOSIS — K219 Gastro-esophageal reflux disease without esophagitis: Secondary | ICD-10-CM | POA: Diagnosis not present

## 2021-12-18 ENCOUNTER — Encounter: Payer: Self-pay | Admitting: Internal Medicine

## 2021-12-18 ENCOUNTER — Ambulatory Visit: Payer: Medicare PPO | Admitting: Internal Medicine

## 2021-12-18 ENCOUNTER — Other Ambulatory Visit: Payer: Self-pay

## 2021-12-18 VITALS — BP 110/72 | HR 80 | Ht 61.0 in | Wt 124.0 lb

## 2021-12-18 DIAGNOSIS — E042 Nontoxic multinodular goiter: Secondary | ICD-10-CM | POA: Diagnosis not present

## 2021-12-18 DIAGNOSIS — N1832 Chronic kidney disease, stage 3b: Secondary | ICD-10-CM | POA: Diagnosis not present

## 2021-12-18 DIAGNOSIS — E0500 Thyrotoxicosis with diffuse goiter without thyrotoxic crisis or storm: Secondary | ICD-10-CM | POA: Diagnosis not present

## 2021-12-18 LAB — BASIC METABOLIC PANEL
BUN: 20 mg/dL (ref 6–23)
CO2: 33 mEq/L — ABNORMAL HIGH (ref 19–32)
Calcium: 10.1 mg/dL (ref 8.4–10.5)
Chloride: 104 mEq/L (ref 96–112)
Creatinine, Ser: 1.29 mg/dL — ABNORMAL HIGH (ref 0.40–1.20)
GFR: 43.25 mL/min — ABNORMAL LOW (ref >60.00)
Glucose, Bld: 89 mg/dL (ref 70–99)
Potassium: 4.1 mEq/L (ref 3.5–5.1)
Sodium: 143 mEq/L (ref 135–145)

## 2021-12-18 LAB — TSH: TSH: 1.82 u[IU]/mL (ref 0.35–5.50)

## 2021-12-18 LAB — T4, FREE: Free T4: 0.77 ng/dL (ref 0.60–1.60)

## 2021-12-18 NOTE — Progress Notes (Signed)
Name: Tammy [name]  MRN/ DOB: [id number], 1956/07/27    Age/ Sex: 66 y.o., female     PCP: Shirline Frees, MD   Reason for Endocrinology Evaluation: Androgenic alopecia/ Graves' Disease     Initial Endocrinology Clinic Visit: 02/05/2019    PATIENT IDENTIFIER: Tammy [name] is a 66 y.o., female with a past medical history of seasonal allergies and androgenic alopecia. She has followed with [facility] since 02/05/2019 for consultative assistance with management of her androgenic alopecia.   HISTORICAL SUMMARY: The patient was referred to [place] by dermatology for the possibility of androgenic alopecia after she was found to have an elevated DHEAS of 190 (reference 12-133 mcg/dL) , with normal testosterone profile (total and free)     Pt noted excessive hair loss for ~ 4 -5 months prior to her presentation. She also noted pruritus at the area of concern and has been scratching her scalp. During dermatology visit she was noted to have hair thinning over the left vertex scalp.     She has been on some form of contraception most of her life, no prior hx of irregular period. No difficulty conceiving in the past.   Found to have graves disease with elevated TRAb and low TSH (0.14 uIu/mL ) but normal FT4 in 04/2019 and was started on methimazole.     CT abdomen 12/2020 showed NO evidence of adrenal tumor Thyroid ultrasound 11/2020 showed MNG   SUBJECTIVE:   Today (12/18/2021):  Tammy [name] is here for a follow up on androgenic alopecia ,elevated DHEAS and Graves' Disease.   She has been noted with weight loss over the past year   Has occasional chronic constipation  Denies palpitations  Denies tremors Denies any local neck symptoms , denies dysphagia    Continues to take Biotin- held   Methimazole 5 mg half a tablet daily       HISTORY:  Past Medical History:  Past Medical History:  Diagnosis Date   Allergy    Past Surgical History: No past surgical  history on file. Social History:  reports that she has never smoked. She has never used smokeless tobacco. No history on file for alcohol use and drug use. Family History:  Family History  Problem Relation Age of Onset   Hypertension Mother      HOME MEDICATIONS: Allergies as of 12/18/2021       Reactions   Cortisporin  [bacitra-neomycin-polymyxin-hc]         Medication List        Accurate as of December 18, 2021  7:48 AM. If you have any questions, ask your nurse or doctor.          Biotin 10000 MCG Tabs Take by mouth. Patient taking Nutrafol   calcium carbonate 1500 (600 Ca) MG Tabs tablet Commonly known as: OSCAL Take by mouth 2 (two) times daily with a meal.   CENTRUM ULTRA WOMENS PO Take by mouth.   CLARITIN PO Take by mouth.   COLLAGEN PO Take by mouth.   Diclofenac Sodium 3 % Gel   dicyclomine 20 MG tablet Commonly known as: BENTYL Take 20 mg by mouth every 6 (six) hours.   estradiol 0.05 MG/24HR patch Commonly known as: VIVELLE-DOT estradiol 0.05 mg/24 hr semiweekly transdermal patch   fluocinonide 0.05 % external solution Commonly known as: LIDEX APPLY 1ML ON THE SKIN AS DIRECTED AS NEEDED FOR ITCH   fluticasone 50 MCG/ACT nasal spray Commonly known as: FLONASE fluticasone propionate 50  mcg/actuation nasal spray,suspension   indapamide 1.25 MG tablet Commonly known as: LOZOL   latanoprost 0.005 % ophthalmic solution Commonly known as: XALATAN Place 1 drop into both eyes daily. What changed: Another medication with the same name was removed. Continue taking this medication, and follow the directions you see here. Changed by: Dorita Sciara, MD   meloxicam 15 MG tablet Commonly known as: MOBIC Take 15 mg by mouth daily as needed.   methimazole 5 MG tablet Commonly known as: TAPAZOLE Take 0.5 tablets (2.5 mg total) by mouth daily.   olopatadine 0.1 % ophthalmic solution Commonly known as: PATANOL   pantoprazole 40 MG  tablet Commonly known as: PROTONIX Take 40 mg by mouth daily.   progesterone 200 MG capsule Commonly known as: PROMETRIUM TAKE 1 CAPSULE BY MOUTH AT BEDTIME FOR 14 DAYS*EVERY 3 MONTHS   VITAMINS FOR THE HAIR PO Take by mouth.          OBJECTIVE:   PHYSICAL EXAM: VS: BP 110/72 (BP Location: Left Arm, Patient Position: Sitting, Cuff Size: Small)    Pulse 80    Ht 5\' 1"  (1.549 m)    Wt 124 lb (56.2 kg)    LMP 07/26/2011    BMI 23.43 kg/m    EXAM: General: Pt appears well and is in NAD  Neck: General: Supple without adenopathy. Thyroid: Right side asymmetry noted on exam today   Lungs: Clear with good BS bilat with no rales, rhonchi, or wheezes  Heart: Auscultation: RRR.  Abdomen: Normoactive bowel sounds, soft, nontender, without masses or organomegaly palpable  Extremities:  BL LE: No pretibial edema normal ROM and strength.  Mental Status: Judgment, insight: Intact Orientation: Oriented to time, place, and person Mood and affect: No depression, anxiety, or agitation     DATA REVIEWED:  Latest Reference Range & Units 12/18/21 07:52  Sodium 135 - 145 mEq/L 143  Potassium 3.5 - 5.1 mEq/L 4.1  Chloride 96 - 112 mEq/L 104  CO2 19 - 32 mEq/L 33 (H)  Glucose 70 - 99 mg/dL 89  BUN 6 - 23 mg/dL 20  Creatinine 0.40 - 1.20 mg/dL 1.29 (H)  Calcium 8.4 - 10.5 mg/dL 10.1  GFR >60.00 mL/min 43.25 (L)    Latest Reference Range & Units 12/18/21 07:52  TSH 0.35 - 5.50 uIU/mL 1.82  T4,Free(Direct) 0.60 - 1.60 ng/dL 0.77      01/09/2019   Total testosterone 18 (2-45) ng/dL  Free T 1.3 (0.2-5.0 ) pg/mL  DHEAS 190 (12-133) mcg/dL     Results for Tammy, [name] (MRN [id number]) as of 05/18/2019 15:38  Ref. Range 05/14/2019 15:50  TRAB Latest Ref Range: <=2.00 IU/L 4.49 (H)   Thyroid Ultrasound 12/16/2020  Estimated total number of nodules >/= 1 cm: 6-10   Number of spongiform nodules >/=  2 cm not described below (TR1): 0   Number of mixed cystic and solid nodules >/=  1.5 cm not described below ([place]): 0   _________________________________________________________   Nodule # 3:   Prior biopsy: No   Location: Right; Mid   Maximum size: 1.0 cm; Other 2 dimensions: 0.9 x 0.9 cm, previously, 1.0 x 1.0 x 0.9 cm   Composition: mixed cystic and solid (1)   Echogenicity: hypoechoic (2)   Shape: not taller-than-wide (0)   Margins: ill-defined (0)   Echogenic foci: none (0)   ACR TI-RADS total points: 3.   ACR TI-RADS risk category:  TR3 (3 points).   Significant change in size (>/= 20%  in two dimensions and minimal increase of 2 mm): No   Change in features: Yes   Change in ACR TI-RADS risk category: Yes   ACR TI-RADS recommendations:   Given size (<1.4 cm) and appearance, this nodule does NOT meet TI-RADS criteria for biopsy or dedicated follow-up.   The remaining scattered bilateral thyroid nodules ranging from 0.9-1.5 cm in maximum diameter are unchanged. There are no new discrete nodules.   _________________________________________________________   IMPRESSION: 1. Similar appearing multinodular goiter. 2. Solid-cystic appearance of the previously visualized right mid thyroid nodule (labeled 3, 1.0 cm) now characterized as TI-RADS category 3. Therefore, no further ultrasound follow-up or tissue sampling is recommended. 3. No new discrete nodules that warrant additional ultrasound follow-up or tissue sampling.    ASSESSMENT / PLAN / RECOMMENDATIONS:   1) Subclinical Hyperthyroidism Secondary to Graves' Disease  - Clinically she is euthyroid  - No local neck symptoms.  - She held Biotin  -TFTs are normal no changes  Medication:  Continue  methimazole 5 mg, Half a tablet daily    2) Multinodular Goiter:  - None meets criteria for FNA based on thyroid ultrasound from 2021 and 2021 - No local neck symptoms - There's more prominence on the right on today's exam will proceed with repeat thyroid ultrasound    3)  Elevated DHEAS:    - Since normalizing her thyroid , her hair growth has improved.  - We discussed that her levels are minimally elevated and in review of the literature, these levels have no clinical significance.  I suspect this is hereditary as her sister has overt hirsutism over the beard area - Testosterone levels have been normal.  - DHEAS stable -Adrenal imaging has been normal - 12/2020   4) CKD III:   -Her GFR has been stable in the low 40s.  I have referred her to nephrology in the summer, but she has not seen them yet.  A new referral will be placed today   F/u in 6 months      Signed electronically by: Mack Guise, MD  [facility]  [facility] [address] [place], [place] [address] Phone: [phone] FAX: [phone]      CC: Shirline Frees, [place] [address] Phone: [phone]  Fax: [phone]   Return to Endocrinology clinic as below: No future appointments.

## 2021-12-21 MED ORDER — METHIMAZOLE 5 MG PO TABS: 2.5000 mg | ORAL_TABLET | Freq: Every day | ORAL | 3 refills | Status: DC

## 2021-12-22 ENCOUNTER — Encounter: Payer: Self-pay | Admitting: Internal Medicine

## 2021-12-22 ENCOUNTER — Ambulatory Visit
Admission: RE | Admit: 2021-12-22 | Discharge: 2021-12-22 | Disposition: A | Discharge status: Home / Self Care | Payer: Medicare PPO | Source: Ambulatory Visit | Attending: Internal Medicine | Admitting: Internal Medicine

## 2021-12-22 DIAGNOSIS — E042 Nontoxic multinodular goiter: Secondary | ICD-10-CM

## 2021-12-23 DIAGNOSIS — Z79890 Hormone replacement therapy (postmenopausal): Secondary | ICD-10-CM | POA: Diagnosis not present

## 2021-12-23 DIAGNOSIS — Z01419 Encounter for gynecological examination (general) (routine) without abnormal findings: Secondary | ICD-10-CM | POA: Diagnosis not present

## 2022-01-19 DIAGNOSIS — Z1211 Encounter for screening for malignant neoplasm of colon: Secondary | ICD-10-CM | POA: Diagnosis not present

## 2022-01-19 DIAGNOSIS — Z86010 Personal history of colonic polyps: Secondary | ICD-10-CM | POA: Diagnosis not present

## 2022-01-19 DIAGNOSIS — K5730 Diverticulosis of large intestine without perforation or abscess without bleeding: Secondary | ICD-10-CM | POA: Diagnosis not present

## 2022-01-21 ENCOUNTER — Encounter: Payer: Self-pay | Admitting: Internal Medicine

## 2022-01-22 DIAGNOSIS — M1990 Unspecified osteoarthritis, unspecified site: Secondary | ICD-10-CM | POA: Diagnosis not present

## 2022-01-22 DIAGNOSIS — R319 Hematuria, unspecified: Secondary | ICD-10-CM | POA: Diagnosis not present

## 2022-01-22 DIAGNOSIS — I129 Hypertensive chronic kidney disease with stage 1 through stage 4 chronic kidney disease, or unspecified chronic kidney disease: Secondary | ICD-10-CM | POA: Diagnosis not present

## 2022-01-22 DIAGNOSIS — N281 Cyst of kidney, acquired: Secondary | ICD-10-CM | POA: Diagnosis not present

## 2022-01-22 DIAGNOSIS — N189 Chronic kidney disease, unspecified: Secondary | ICD-10-CM | POA: Diagnosis not present

## 2022-01-22 DIAGNOSIS — N1831 Chronic kidney disease, stage 3a: Secondary | ICD-10-CM | POA: Diagnosis not present

## 2022-01-22 DIAGNOSIS — N1832 Chronic kidney disease, stage 3b: Secondary | ICD-10-CM | POA: Diagnosis not present

## 2022-01-22 DIAGNOSIS — E0500 Thyrotoxicosis with diffuse goiter without thyrotoxic crisis or storm: Secondary | ICD-10-CM | POA: Diagnosis not present

## 2022-01-22 DIAGNOSIS — L649 Androgenic alopecia, unspecified: Secondary | ICD-10-CM | POA: Diagnosis not present

## 2022-03-04 DIAGNOSIS — H40013 Open angle with borderline findings, low risk, bilateral: Secondary | ICD-10-CM | POA: Diagnosis not present

## 2022-03-04 DIAGNOSIS — H43812 Vitreous degeneration, left eye: Secondary | ICD-10-CM | POA: Diagnosis not present

## 2022-03-04 DIAGNOSIS — H25813 Combined forms of age-related cataract, bilateral: Secondary | ICD-10-CM | POA: Diagnosis not present

## 2022-05-17 DIAGNOSIS — H6121 Impacted cerumen, right ear: Secondary | ICD-10-CM | POA: Diagnosis not present

## 2022-06-15 DIAGNOSIS — G43909 Migraine, unspecified, not intractable, without status migrainosus: Secondary | ICD-10-CM | POA: Diagnosis not present

## 2022-06-15 DIAGNOSIS — K219 Gastro-esophageal reflux disease without esophagitis: Secondary | ICD-10-CM | POA: Diagnosis not present

## 2022-06-15 DIAGNOSIS — L309 Dermatitis, unspecified: Secondary | ICD-10-CM | POA: Diagnosis not present

## 2022-06-15 DIAGNOSIS — M1990 Unspecified osteoarthritis, unspecified site: Secondary | ICD-10-CM | POA: Diagnosis not present

## 2022-06-15 DIAGNOSIS — I10 Essential (primary) hypertension: Secondary | ICD-10-CM | POA: Diagnosis not present

## 2022-06-15 DIAGNOSIS — E0500 Thyrotoxicosis with diffuse goiter without thyrotoxic crisis or storm: Secondary | ICD-10-CM | POA: Diagnosis not present

## 2022-06-15 DIAGNOSIS — K589 Irritable bowel syndrome without diarrhea: Secondary | ICD-10-CM | POA: Diagnosis not present

## 2022-06-15 DIAGNOSIS — E7800 Pure hypercholesterolemia, unspecified: Secondary | ICD-10-CM | POA: Diagnosis not present

## 2022-06-15 DIAGNOSIS — J309 Allergic rhinitis, unspecified: Secondary | ICD-10-CM | POA: Diagnosis not present

## 2022-06-24 DIAGNOSIS — M545 Low back pain, unspecified: Secondary | ICD-10-CM | POA: Diagnosis not present

## 2022-07-19 DIAGNOSIS — N1831 Chronic kidney disease, stage 3a: Secondary | ICD-10-CM | POA: Diagnosis not present

## 2022-08-19 DIAGNOSIS — G5603 Carpal tunnel syndrome, bilateral upper limbs: Secondary | ICD-10-CM | POA: Diagnosis not present

## 2022-08-19 DIAGNOSIS — M25512 Pain in left shoulder: Secondary | ICD-10-CM | POA: Diagnosis not present

## 2022-09-01 IMAGING — MG MM DIGITAL SCREENING BILAT W/ TOMO AND CAD
8 series · 8 of 24 positions shown · non-contrast
Comparison: Previous exam(s).

CLINICAL DATA: Screening.

EXAM:
DIGITAL SCREENING BILATERAL MAMMOGRAM WITH TOMOSYNTHESIS AND CAD
TECHNIQUE: Bilateral screening digital craniocaudal and mediolateral oblique
mammograms were obtained. Bilateral screening digital breast
tomosynthesis was performed. The images were evaluated with
computer-aided detection.

[R CC synth-2D]
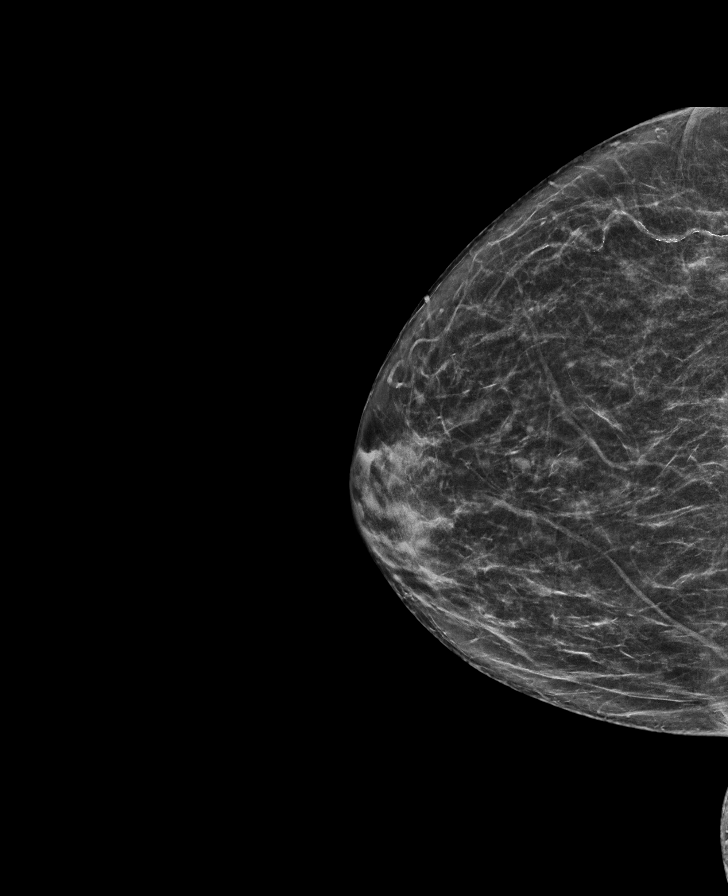

[L MLO synth-2D]
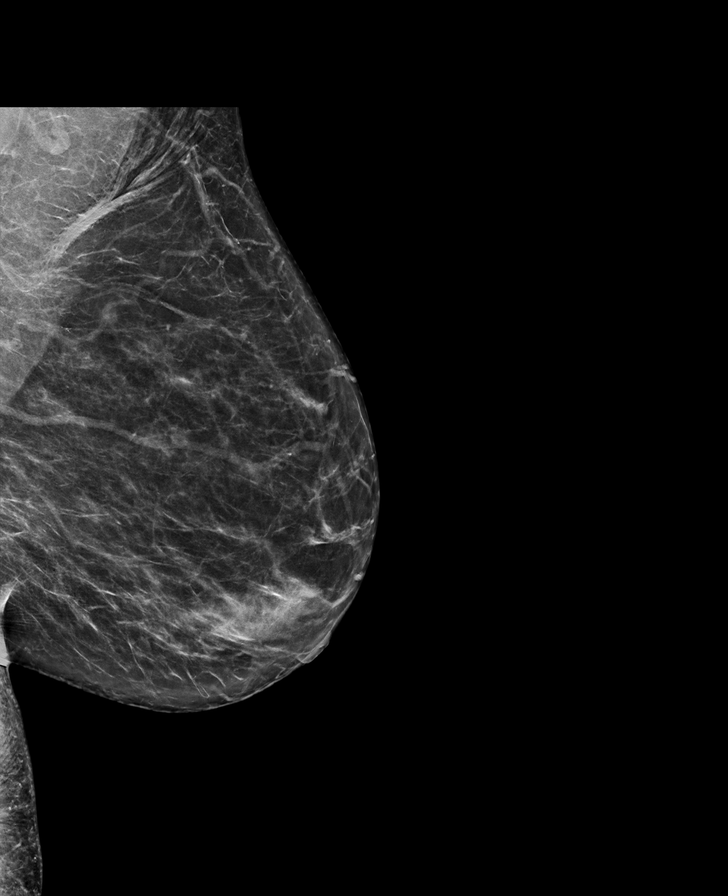

[L CC synth-2D]
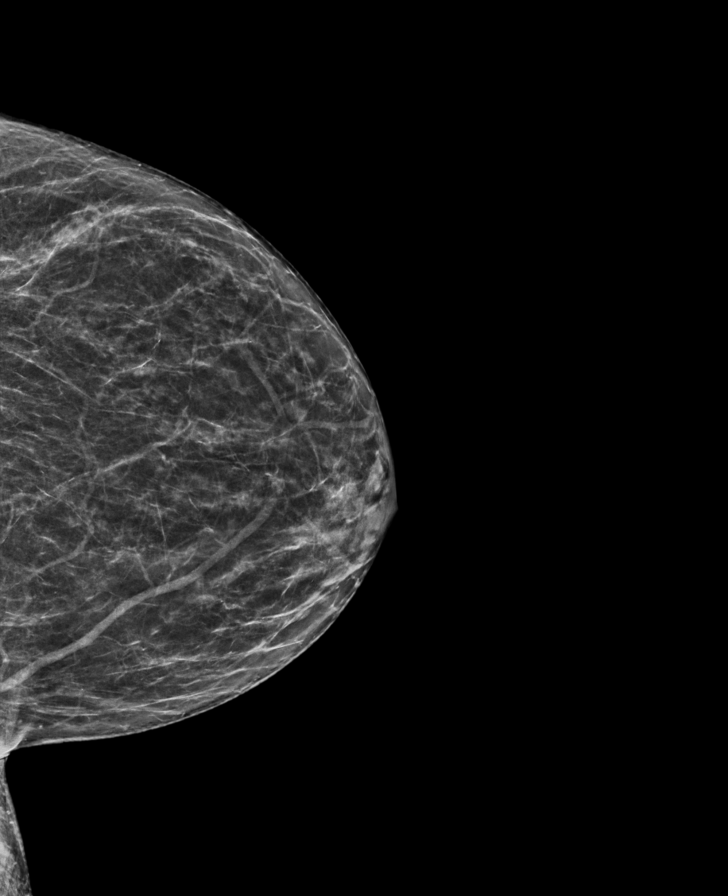

[R MLO synth-2D]
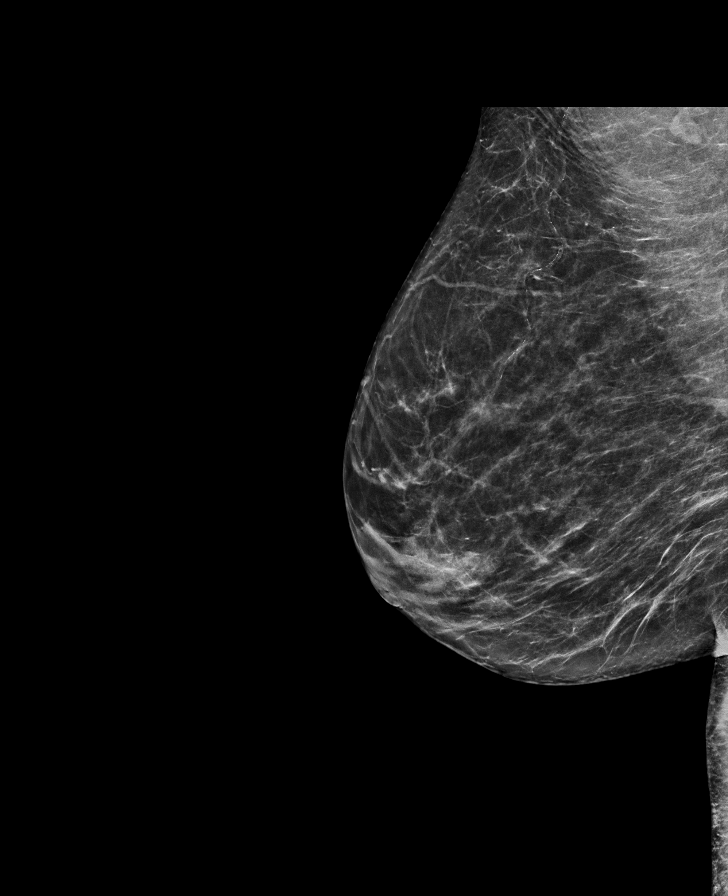

[R MLO tomo · tomo slice 35/69.0]
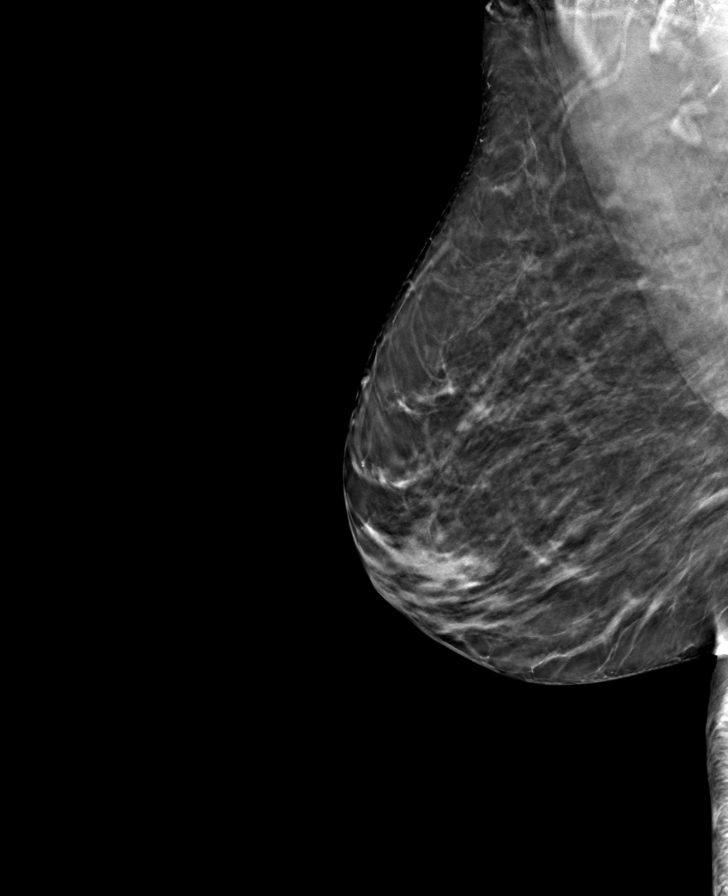

[L MLO tomo · tomo slice 37/73.0]
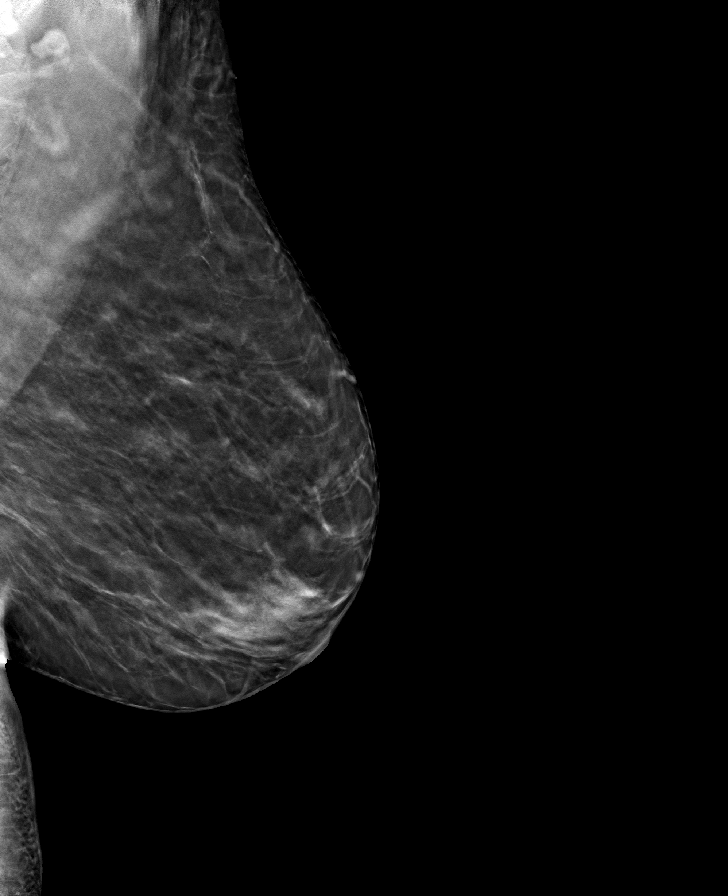

[L CC tomo · tomo slice 31/61.0]
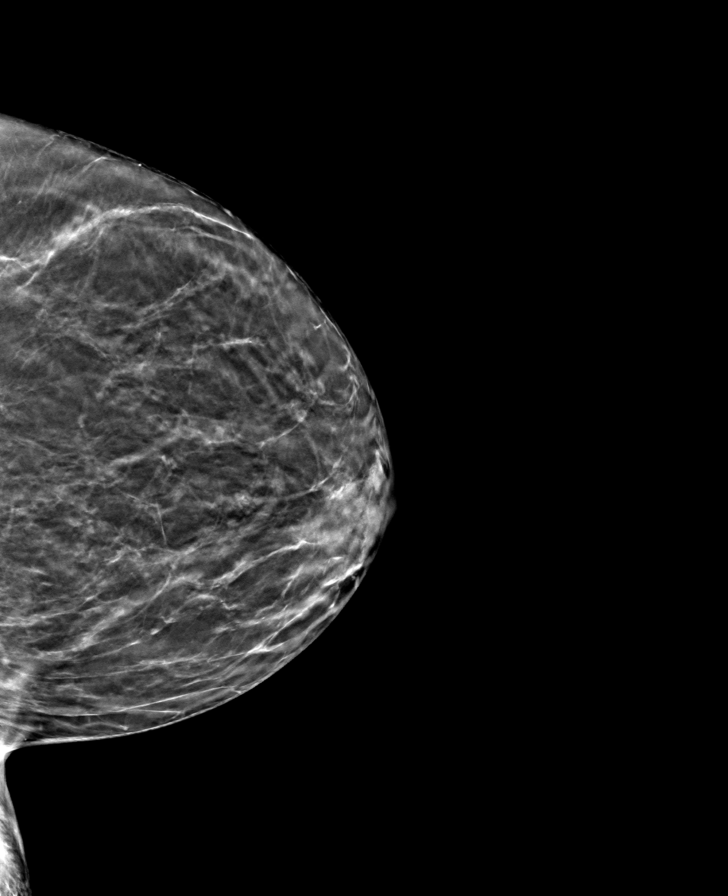

[R CC tomo · tomo slice 33/64.0]
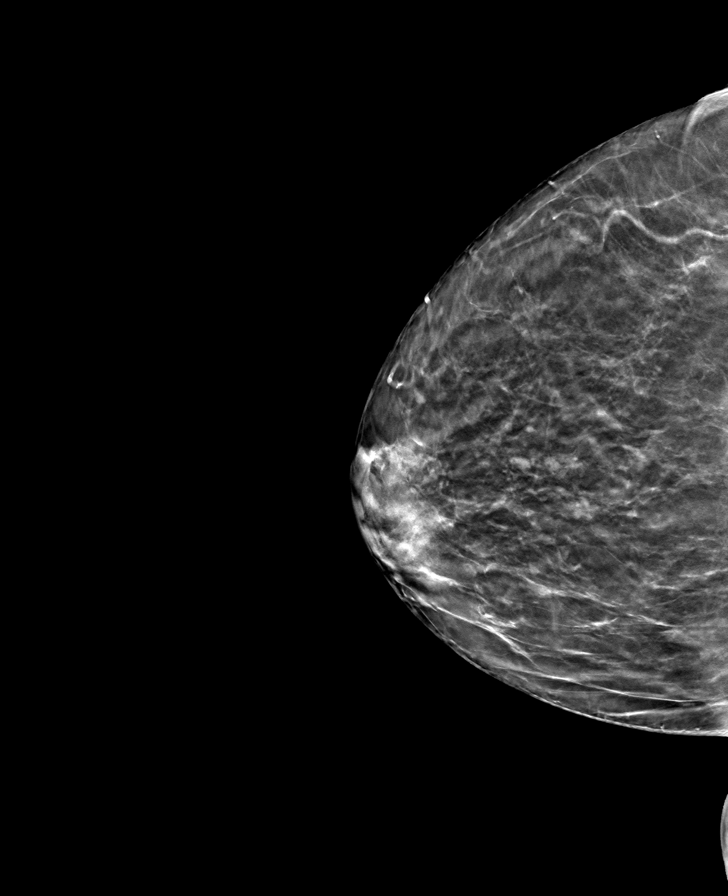

[8 of 24 positions shown; findings below may reference images not displayed]

ACR Breast Density Category b: There are scattered areas of
fibroglandular density.
FINDINGS: There are no findings suspicious for malignancy.
IMPRESSION: No mammographic evidence of malignancy. A result letter of this
screening mammogram will be mailed directly to the patient.

RECOMMENDATION:
Screening mammogram in one year. (Code:51-O-LD2)

BI-RADS CATEGORY  1: Negative.

## 2022-09-07 DIAGNOSIS — M25512 Pain in left shoulder: Secondary | ICD-10-CM | POA: Diagnosis not present

## 2022-09-13 DIAGNOSIS — M25512 Pain in left shoulder: Secondary | ICD-10-CM | POA: Diagnosis not present

## 2022-09-15 DIAGNOSIS — M25512 Pain in left shoulder: Secondary | ICD-10-CM | POA: Diagnosis not present

## 2022-09-21 DIAGNOSIS — M25512 Pain in left shoulder: Secondary | ICD-10-CM | POA: Diagnosis not present

## 2022-09-21 IMAGING — US US THYROID
1 series · 14 of 25 positions shown · non-contrast
Comparison: 12/16/2020, 12/17/2019

CLINICAL DATA: Prior ultrasound follow-up.

EXAM:
THYROID ULTRASOUND
TECHNIQUE: Ultrasound examination of the thyroid gland and adjacent soft
tissues was performed.

[Series 1: us thyroid · 0.06mm/px · 14 of 73 slices shown]
[im 1/73]
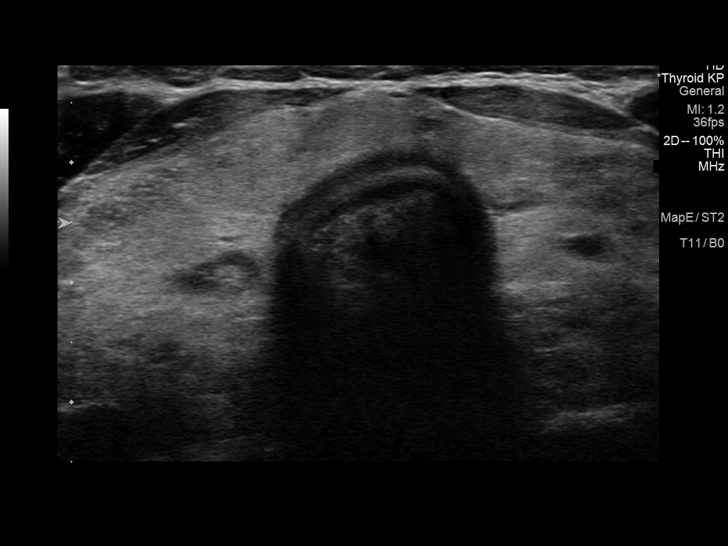
[im 7/73]
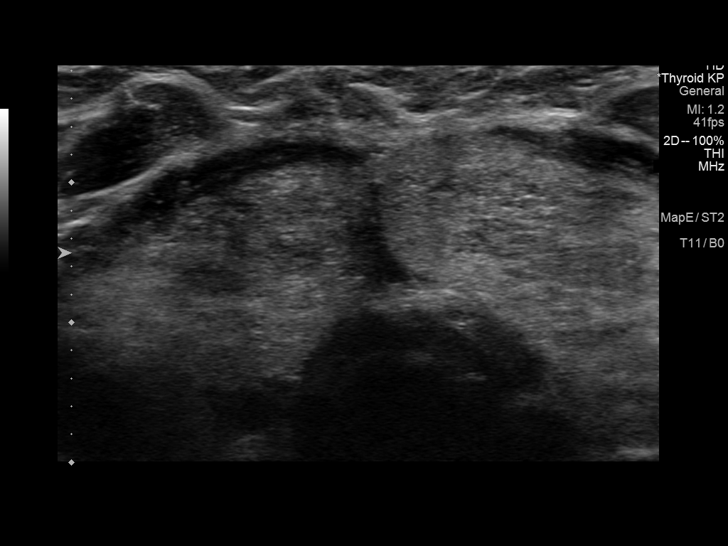
[im 13/73]
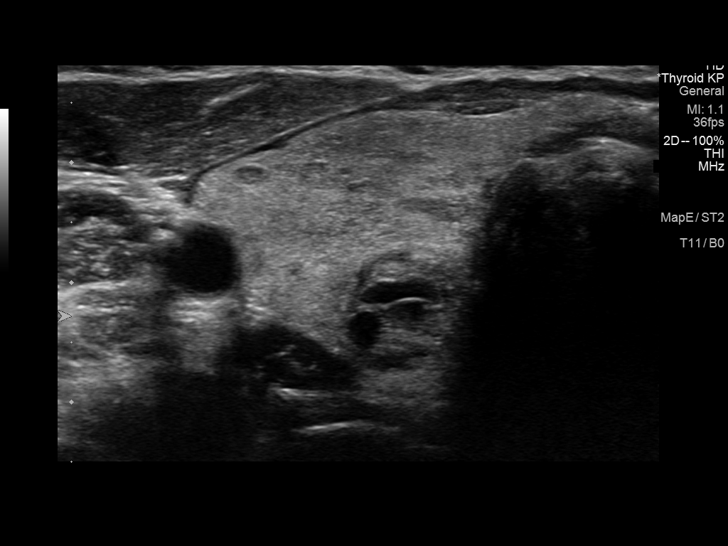
[im 19/73]
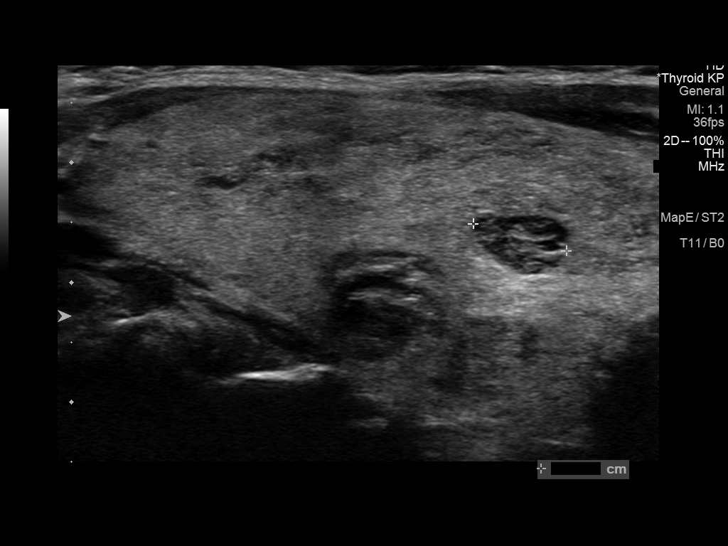
[im 25/73]
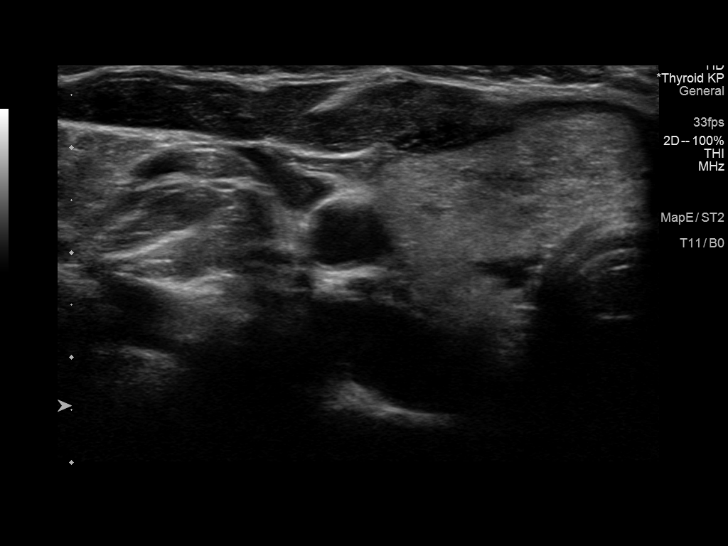
[im 28/73]
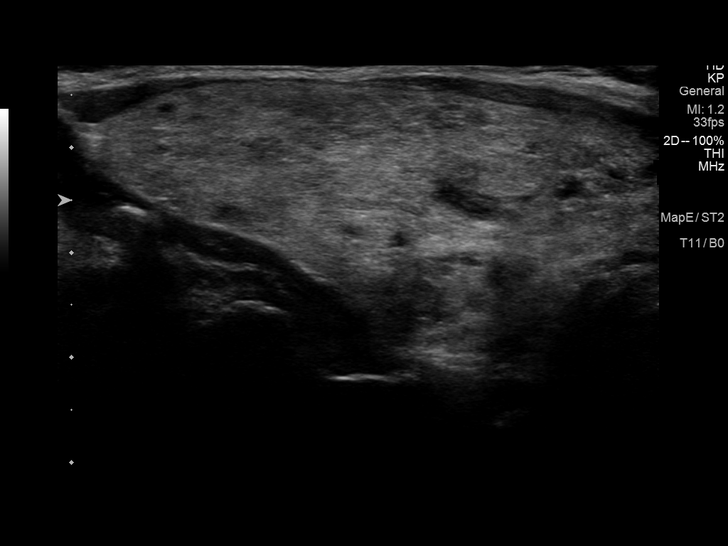
[im 34/73]
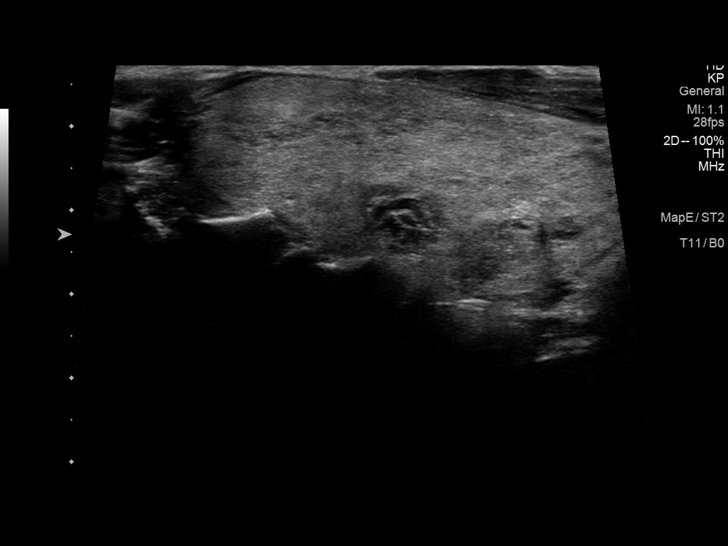
[im 40/73]
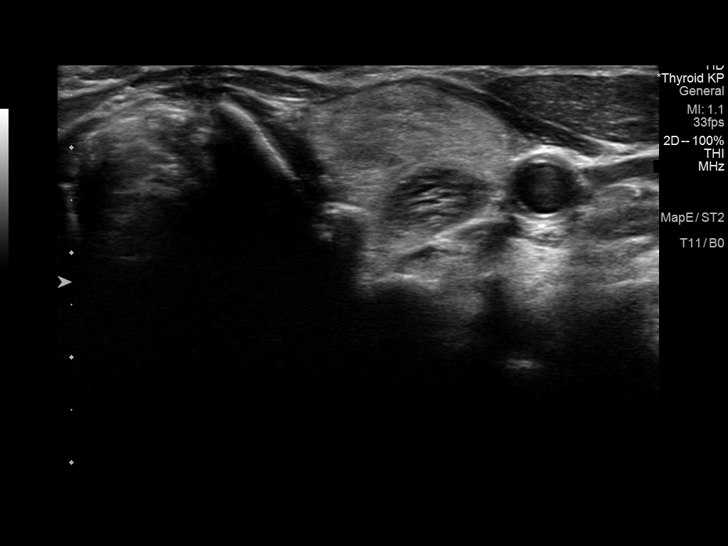
[im 46/73]
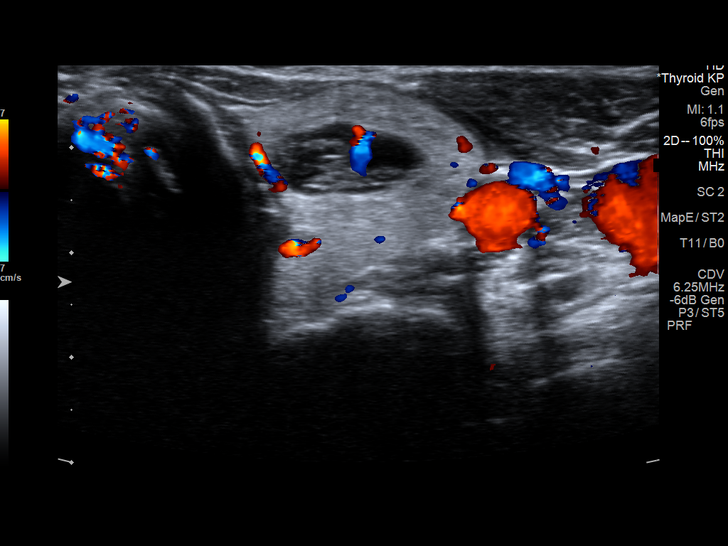
[im 49/73]
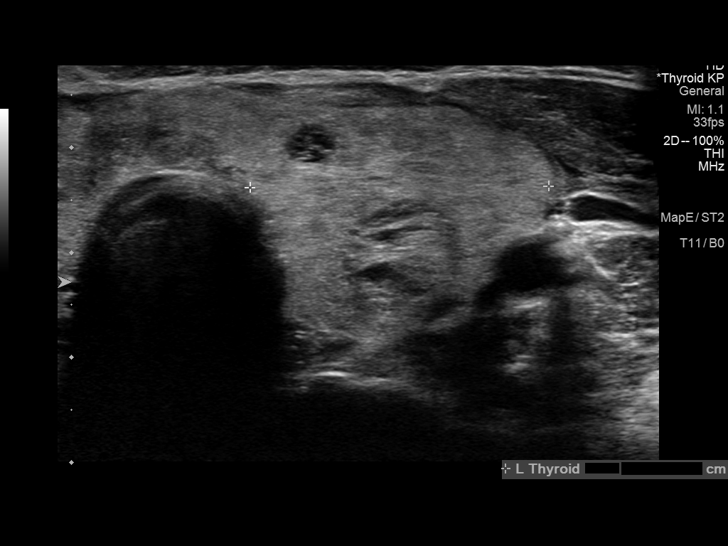
[im 55/73]
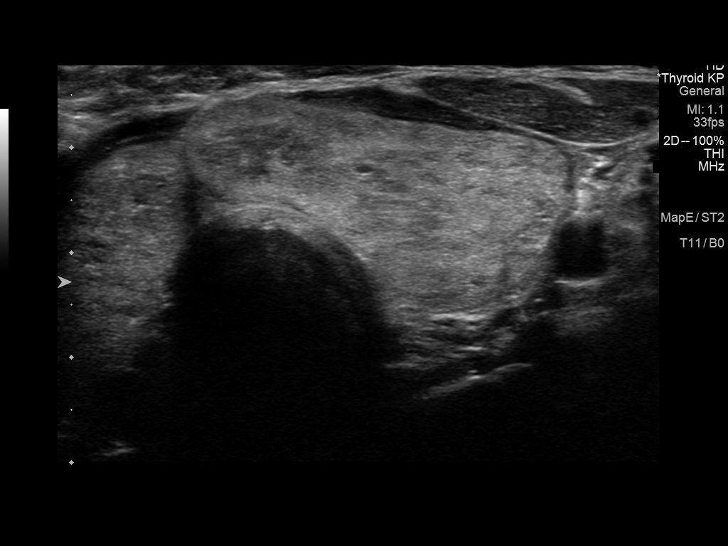
[im 61/73]
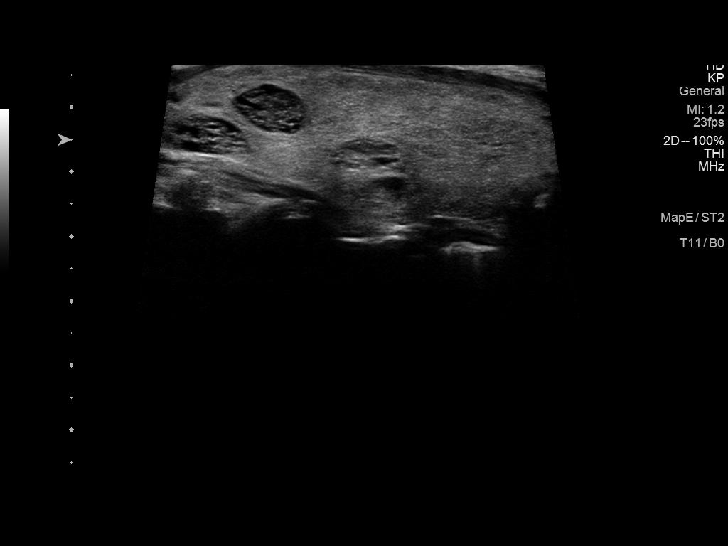
[im 67/73]
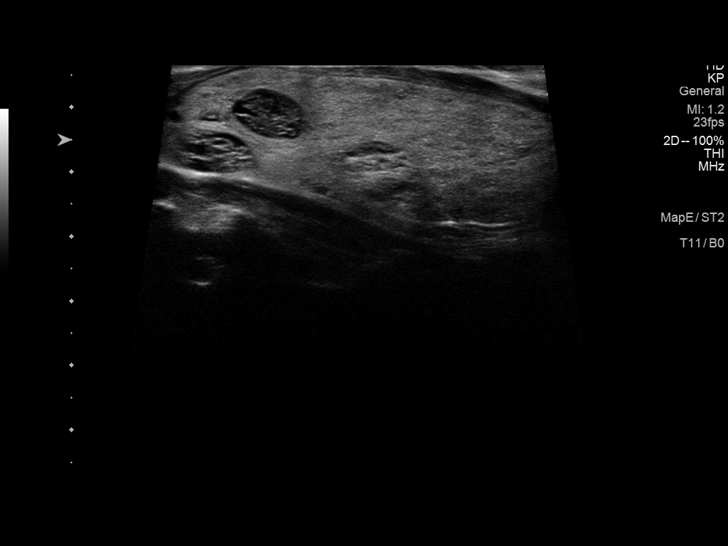
[im 73/73]
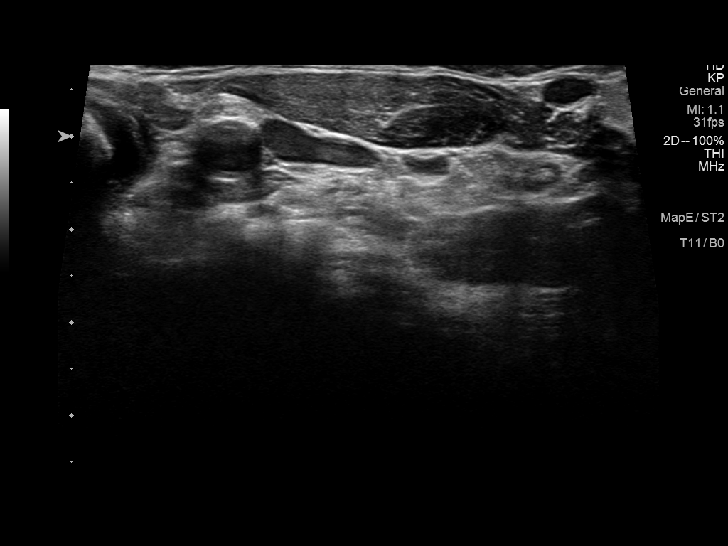

[14 of 25 positions shown; findings below may reference images not displayed]

FINDINGS: Parenchymal Echotexture: Mildly heterogenous

Isthmus: 0.9 cm, previously 0.6 cm

Right lobe: 5.7 x 2.6 x 2.6 cm, previously 5.7 x 2.6 x 2.4 cm

Left lobe: 6.7 x 2.7 x 2.9 cm, previously 6.3 x 2.6 x 2.4 cm

_________________________________________________________

Estimated total number of nodules >/= 1 cm: 6-10

Number of spongiform nodules >/=  2 cm not described below (TR1): 0

Number of mixed cystic and solid nodules >/= 1.5 cm not described
below (TR2): 0

_________________________________________________________

Stable appearance of multifocal spongiform and solid cystic thyroid
nodules bilaterally ranging from 0.9 to 1.5 cm in maximum diameters.
No new discrete nodules.
IMPRESSION: Similar appearing benign multinodular goiter.

The above is in keeping with the ACR TI-RADS recommendations - [HOSPITAL] 5254;[DATE].

## 2022-09-23 DIAGNOSIS — M25512 Pain in left shoulder: Secondary | ICD-10-CM | POA: Diagnosis not present

## 2022-09-28 DIAGNOSIS — M25512 Pain in left shoulder: Secondary | ICD-10-CM | POA: Diagnosis not present

## 2022-09-30 DIAGNOSIS — M25512 Pain in left shoulder: Secondary | ICD-10-CM | POA: Diagnosis not present

## 2022-10-04 DIAGNOSIS — M25512 Pain in left shoulder: Secondary | ICD-10-CM | POA: Diagnosis not present

## 2022-10-04 DIAGNOSIS — N182 Chronic kidney disease, stage 2 (mild): Secondary | ICD-10-CM | POA: Diagnosis not present

## 2022-10-04 DIAGNOSIS — M1990 Unspecified osteoarthritis, unspecified site: Secondary | ICD-10-CM | POA: Diagnosis not present

## 2022-10-04 DIAGNOSIS — I129 Hypertensive chronic kidney disease with stage 1 through stage 4 chronic kidney disease, or unspecified chronic kidney disease: Secondary | ICD-10-CM | POA: Diagnosis not present

## 2022-10-04 DIAGNOSIS — N281 Cyst of kidney, acquired: Secondary | ICD-10-CM | POA: Diagnosis not present

## 2022-10-05 DIAGNOSIS — M25512 Pain in left shoulder: Secondary | ICD-10-CM | POA: Diagnosis not present

## 2022-10-20 ENCOUNTER — Other Ambulatory Visit: Payer: Self-pay | Admitting: Gynecology

## 2022-10-20 DIAGNOSIS — Z1231 Encounter for screening mammogram for malignant neoplasm of breast: Secondary | ICD-10-CM

## 2022-11-15 DIAGNOSIS — I7300 Raynaud's syndrome without gangrene: Secondary | ICD-10-CM | POA: Diagnosis not present

## 2022-11-15 DIAGNOSIS — Z6823 Body mass index (BMI) 23.0-23.9, adult: Secondary | ICD-10-CM | POA: Diagnosis not present

## 2022-11-15 DIAGNOSIS — M79641 Pain in right hand: Secondary | ICD-10-CM | POA: Diagnosis not present

## 2022-11-15 DIAGNOSIS — R5383 Other fatigue: Secondary | ICD-10-CM | POA: Diagnosis not present

## 2022-11-15 DIAGNOSIS — M79642 Pain in left hand: Secondary | ICD-10-CM | POA: Diagnosis not present

## 2022-11-15 DIAGNOSIS — M1991 Primary osteoarthritis, unspecified site: Secondary | ICD-10-CM | POA: Diagnosis not present

## 2022-11-15 DIAGNOSIS — G5603 Carpal tunnel syndrome, bilateral upper limbs: Secondary | ICD-10-CM | POA: Diagnosis not present

## 2022-11-16 DIAGNOSIS — M25512 Pain in left shoulder: Secondary | ICD-10-CM | POA: Diagnosis not present

## 2022-11-30 DIAGNOSIS — I7300 Raynaud's syndrome without gangrene: Secondary | ICD-10-CM | POA: Diagnosis not present

## 2022-11-30 DIAGNOSIS — M154 Erosive (osteo)arthritis: Secondary | ICD-10-CM | POA: Diagnosis not present

## 2022-11-30 DIAGNOSIS — G5603 Carpal tunnel syndrome, bilateral upper limbs: Secondary | ICD-10-CM | POA: Diagnosis not present

## 2022-11-30 DIAGNOSIS — Z6823 Body mass index (BMI) 23.0-23.9, adult: Secondary | ICD-10-CM | POA: Diagnosis not present

## 2022-11-30 DIAGNOSIS — M79642 Pain in left hand: Secondary | ICD-10-CM | POA: Diagnosis not present

## 2022-11-30 DIAGNOSIS — M79641 Pain in right hand: Secondary | ICD-10-CM | POA: Diagnosis not present

## 2022-12-09 ENCOUNTER — Ambulatory Visit
Admission: RE | Admit: 2022-12-09 | Discharge: 2022-12-09 | Disposition: A | Discharge status: Home / Self Care | Payer: Medicare PPO | Source: Ambulatory Visit | Attending: Gynecology | Admitting: Gynecology

## 2022-12-09 DIAGNOSIS — Z1231 Encounter for screening mammogram for malignant neoplasm of breast: Secondary | ICD-10-CM

## 2022-12-22 ENCOUNTER — Encounter: Payer: Self-pay | Admitting: Internal Medicine

## 2022-12-22 ENCOUNTER — Ambulatory Visit: Payer: Medicare PPO | Admitting: Internal Medicine

## 2022-12-22 ENCOUNTER — Telehealth: Payer: Self-pay | Admitting: Internal Medicine

## 2022-12-22 VITALS — BP 120/74 | HR 84 | Ht 61.0 in | Wt 121.0 lb

## 2022-12-22 DIAGNOSIS — E042 Nontoxic multinodular goiter: Secondary | ICD-10-CM | POA: Diagnosis not present

## 2022-12-22 DIAGNOSIS — E0500 Thyrotoxicosis with diffuse goiter without thyrotoxic crisis or storm: Secondary | ICD-10-CM | POA: Diagnosis not present

## 2022-12-22 DIAGNOSIS — N1832 Chronic kidney disease, stage 3b: Secondary | ICD-10-CM

## 2022-12-22 LAB — BASIC METABOLIC PANEL
BUN: 17 mg/dL (ref 6–23)
CO2: 29 mEq/L (ref 19–32)
Calcium: 10.2 mg/dL (ref 8.4–10.5)
Chloride: 102 mEq/L (ref 96–112)
Creatinine, Ser: 1.22 mg/dL — ABNORMAL HIGH (ref 0.40–1.20)
GFR: 45.92 mL/min — ABNORMAL LOW (ref >60.00)
Glucose, Bld: 86 mg/dL (ref 70–99)
Potassium: 3.8 mEq/L (ref 3.5–5.1)
Sodium: 141 mEq/L (ref 135–145)

## 2022-12-22 LAB — T4, FREE: Free T4: 0.94 ng/dL (ref 0.60–1.60)

## 2022-12-22 LAB — TSH: TSH: 8.11 u[IU]/mL — ABNORMAL HIGH (ref 0.35–5.50)

## 2022-12-22 NOTE — Telephone Encounter (Signed)
Can you please contact the patient and schedule her for a lab only appointment in 2 months for a recheck on her thyroid function?   Thank you

## 2022-12-22 NOTE — Progress Notes (Signed)
Name: Tammy [name]  MRN/ DOB: [id number], [birth year]    Age/ Sex: 67 y.o., female     PCP: Shirline Frees, MD   Reason for Endocrinology Evaluation: Androgenic alopecia/ Graves' Disease     Initial Endocrinology Clinic Visit: 02/05/2019    PATIENT IDENTIFIER: Tammy [name] is a 67 y.o., female with a past medical history of seasonal allergies and androgenic alopecia. She has followed with [facility] since 02/05/2019 for consultative assistance with management of her androgenic alopecia.   HISTORICAL SUMMARY: The patient was referred to [place] by dermatology for the possibility of androgenic alopecia after she was found to have an elevated DHEAS of 190 (reference 12-133 mcg/dL) , with normal testosterone profile (total and free)     Pt noted excessive hair loss for ~ 4 -5 months prior to her presentation. She also noted pruritus at the area of concern and has been scratching her scalp. During dermatology visit she was noted to have hair thinning over the left vertex scalp.     She has been on some form of contraception most of her life, no prior hx of irregular period. No difficulty conceiving in the past.   Found to have graves disease with elevated TRAb and low TSH (0.14 uIu/mL ) but normal FT4 in 04/2019 and was started on methimazole.     CT abdomen 12/2020 showed NO evidence of adrenal tumor Thyroid ultrasound 11/2020 showed MNG   SUBJECTIVE:   Today (12/22/2022):  Tammy [name] is here for a follow up on androgenic alopecia ,elevated DHEAS and Graves' Disease.    She has been following with Gyn , on HRT  Has occasional neck tightness , denies dysphagia except for once last week during vitamin intake  Has occasional acid reflux  Denies palpitations  Continues with chronic constipation  Denies tremors Denies hirsutism per se but plucks chin  Hair loss is stable   Saw nephrology , meloxican was stopped and now on hydroxychloroquine  Tammy [name]     Continues to take Biotin- held   Methimazole 5 mg half a tablet daily       HISTORY:  Past Medical History:  Past Medical History:  Diagnosis Date   Allergy    Past Surgical History: No past surgical history on file. Social History:  reports that she has never smoked. She has never used smokeless tobacco. No history on file for alcohol use and drug use. Family History:  Family History  Problem Relation Age of Onset   Hypertension Mother      HOME MEDICATIONS: Allergies as of 12/22/2022       Reactions   Cortisporin  [bacitra-neomycin-polymyxin-hc]         Medication List        Accurate as of December 22, 2022  7:43 AM. If you have any questions, ask your nurse or doctor.          Biotin 10000 MCG Tabs Take by mouth. Patient taking Nutrafol   calcium carbonate 1500 (600 Ca) MG Tabs tablet Commonly known as: OSCAL Take by mouth 2 (two) times daily with a meal.   CENTRUM ULTRA WOMENS PO Take by mouth.   CLARITIN PO Take by mouth.   COLLAGEN PO Take by mouth.   Diclofenac Sodium 3 % Gel   dicyclomine 20 MG tablet Commonly known as: BENTYL Take 20 mg by mouth every 6 (six) hours.   estradiol 0.05 MG/24HR patch Commonly known as: VIVELLE-DOT estradiol 0.05 mg/24 hr semiweekly  transdermal patch   fluocinonide 0.05 % external solution Commonly known as: LIDEX APPLY 1ML ON THE SKIN AS DIRECTED AS NEEDED FOR ITCH   fluticasone 50 MCG/ACT nasal spray Commonly known as: FLONASE fluticasone propionate 50 mcg/actuation nasal spray,suspension   hydroxychloroquine 200 MG tablet Commonly known as: PLAQUENIL Take 200 mg by mouth 2 (two) times daily.   indapamide 1.25 MG tablet Commonly known as: LOZOL   latanoprost 0.005 % ophthalmic solution Commonly known as: XALATAN Place 1 drop into both eyes daily.   meloxicam 15 MG tablet Commonly known as: MOBIC Take 15 mg by mouth daily as needed.   methimazole 5 MG tablet Commonly known as:  TAPAZOLE Take 0.5 tablets (2.5 mg total) by mouth daily.   olopatadine 0.1 % ophthalmic solution Commonly known as: PATANOL   pantoprazole 40 MG tablet Commonly known as: PROTONIX Take 40 mg by mouth daily.   progesterone 200 MG capsule Commonly known as: PROMETRIUM TAKE 1 CAPSULE BY MOUTH AT BEDTIME FOR 14 DAYS*EVERY 3 MONTHS   VITAMINS FOR THE HAIR PO Take by mouth.          OBJECTIVE:   PHYSICAL EXAM: VS: BP 120/74 (BP Location: Left Arm, Patient Position: Sitting, Cuff Size: Small)   Pulse 84   Ht '5\' 1"'$  (1.549 m)   Wt 121 lb (54.9 kg)   LMP 07/26/2011   SpO2 97%   BMI 22.86 kg/m    EXAM: General: Pt appears well and is in NAD  Neck: General: Supple without adenopathy. Thyroid: Right side asymmetry noted on exam today   Lungs: Clear with good BS bilat with no rales, rhonchi, or wheezes  Heart: Auscultation: RRR.  Abdomen: Soft, nontender, without masses or organomegaly palpable  Extremities:  BL LE: No pretibial edema normal ROM and strength.  Mental Status: Judgment, insight: Intact Orientation: Oriented to time, place, and person Mood and affect: No depression, anxiety, or agitation     DATA REVIEWED:   Latest Reference Range & Units 12/22/22 07:54  TSH 0.35 - 5.50 uIU/mL 8.11 (H)  T4,Free(Direct) 0.60 - 1.60 ng/dL 0.94  (H): Data is abnormally high   Latest Reference Range & Units 12/18/21 07:52  Sodium 135 - 145 mEq/L 143  Potassium 3.5 - 5.1 mEq/L 4.1  Chloride 96 - 112 mEq/L 104  CO2 19 - 32 mEq/L 33 (H)  Glucose 70 - 99 mg/dL 89  BUN 6 - 23 mg/dL 20  Creatinine 0.40 - 1.20 mg/dL 1.29 (H)  Calcium 8.4 - 10.5 mg/dL 10.1  GFR >60.00 mL/min 43.25 (L)     01/09/2019   Total testosterone 18 (2-45) ng/dL  Free T 1.3 (0.2-5.0 ) pg/mL  DHEAS 190 (12-133) mcg/dL     Results for Tammy [name], Tammy [name] (MRN [id number]) as of 05/18/2019 15:38  Ref. Range 05/14/2019 15:50  TRAB Latest Ref Range: <=2.00 IU/L 4.49 (H)   Thyroid Ultrasound  12/22/2021  Estimated total number of nodules >/= 1 cm: 6-10   Number of spongiform nodules >/=  2 cm not described below (TR1): 0   Number of mixed cystic and solid nodules >/= 1.5 cm not described below ([place]): 0   _________________________________________________________   Stable appearance of multifocal spongiform and solid cystic thyroid nodules bilaterally ranging from 0.9 to 1.5 cm in maximum diameters. No new discrete nodules.   IMPRESSION: Similar appearing benign multinodular goiter.   ASSESSMENT / PLAN / RECOMMENDATIONS:   1) Subclinical Hyperthyroidism Secondary to Graves' Disease  - Clinically she is euthyroid  -Biochemically  the patient has elevated TSH, will discontinue methimazole, and recheck labs in 2 months - She held Biotin   Medication: Stop methimazole    2) Multinodular Goiter:  - None meets criteria for FNA based on thyroid ultrasound from 2021 and 2023 - No local neck symptoms   3) Elevated DHEAS:    - Since normalizing her thyroid , her hair growth has improved.  - We discussed that her levels are minimally elevated and in review of the literature, these levels have no clinical significance.  I suspect this is hereditary as her sister has overt hirsutism over the beard area - Testosterone levels have been normal.  - DHEAS stable -Adrenal imaging has been normal - 12/2020  4) CKD III:  -She met with nephrology, meloxicam was discontinued -GFR today shows slight improvement  F/u in 6 months    Addendum: Lab results discussed with the patient on 12/22/2022 in response to a portal message where she had questions about this    Signed electronically by: Mack Guise, MD  [facility]  [facility] [address] [place], [place] [address] Phone: [phone] FAX: [phone]      CC: Shirline Frees, [place] [address] Phone: [phone]  Fax:  [phone]   Return to Endocrinology clinic as below: No future appointments.

## 2022-12-22 NOTE — Telephone Encounter (Signed)
Patient is scheduled for 02/24/2023 for labs to recheck thyroid function.

## 2022-12-23 DIAGNOSIS — M1990 Unspecified osteoarthritis, unspecified site: Secondary | ICD-10-CM | POA: Diagnosis not present

## 2022-12-23 DIAGNOSIS — K219 Gastro-esophageal reflux disease without esophagitis: Secondary | ICD-10-CM | POA: Diagnosis not present

## 2022-12-23 DIAGNOSIS — E7800 Pure hypercholesterolemia, unspecified: Secondary | ICD-10-CM | POA: Diagnosis not present

## 2022-12-23 DIAGNOSIS — I10 Essential (primary) hypertension: Secondary | ICD-10-CM | POA: Diagnosis not present

## 2022-12-23 DIAGNOSIS — K589 Irritable bowel syndrome without diarrhea: Secondary | ICD-10-CM | POA: Diagnosis not present

## 2022-12-23 DIAGNOSIS — N1831 Chronic kidney disease, stage 3a: Secondary | ICD-10-CM | POA: Diagnosis not present

## 2022-12-23 DIAGNOSIS — G43909 Migraine, unspecified, not intractable, without status migrainosus: Secondary | ICD-10-CM | POA: Diagnosis not present

## 2022-12-23 DIAGNOSIS — J309 Allergic rhinitis, unspecified: Secondary | ICD-10-CM | POA: Diagnosis not present

## 2022-12-23 DIAGNOSIS — E0500 Thyrotoxicosis with diffuse goiter without thyrotoxic crisis or storm: Secondary | ICD-10-CM | POA: Diagnosis not present

## 2022-12-29 DIAGNOSIS — Z79890 Hormone replacement therapy (postmenopausal): Secondary | ICD-10-CM | POA: Diagnosis not present

## 2022-12-29 DIAGNOSIS — Z780 Asymptomatic menopausal state: Secondary | ICD-10-CM | POA: Diagnosis not present

## 2022-12-29 DIAGNOSIS — Z01419 Encounter for gynecological examination (general) (routine) without abnormal findings: Secondary | ICD-10-CM | POA: Diagnosis not present

## 2023-02-11 DIAGNOSIS — L811 Chloasma: Secondary | ICD-10-CM | POA: Diagnosis not present

## 2023-02-24 ENCOUNTER — Other Ambulatory Visit (INDEPENDENT_AMBULATORY_CARE_PROVIDER_SITE_OTHER): Payer: Medicare PPO

## 2023-02-24 ENCOUNTER — Encounter: Payer: Self-pay | Admitting: Internal Medicine

## 2023-02-24 DIAGNOSIS — E0500 Thyrotoxicosis with diffuse goiter without thyrotoxic crisis or storm: Secondary | ICD-10-CM

## 2023-02-24 LAB — T4, FREE: Free T4: 1.34 ng/dL (ref 0.60–1.60)

## 2023-02-24 LAB — TSH: TSH: 0.06 u[IU]/mL — ABNORMAL LOW (ref 0.35–5.50)

## 2023-02-25 MED ORDER — METHIMAZOLE 5 MG PO TABS: 5.0000 mg | ORAL_TABLET | ORAL | 3 refills | Status: DC

## 2023-03-02 DIAGNOSIS — G5603 Carpal tunnel syndrome, bilateral upper limbs: Secondary | ICD-10-CM | POA: Diagnosis not present

## 2023-03-02 DIAGNOSIS — M13841 Other specified arthritis, right hand: Secondary | ICD-10-CM | POA: Diagnosis not present

## 2023-03-04 DIAGNOSIS — M79641 Pain in right hand: Secondary | ICD-10-CM | POA: Diagnosis not present

## 2023-03-04 DIAGNOSIS — Z6823 Body mass index (BMI) 23.0-23.9, adult: Secondary | ICD-10-CM | POA: Diagnosis not present

## 2023-03-04 DIAGNOSIS — I7300 Raynaud's syndrome without gangrene: Secondary | ICD-10-CM | POA: Diagnosis not present

## 2023-03-04 DIAGNOSIS — M154 Erosive (osteo)arthritis: Secondary | ICD-10-CM | POA: Diagnosis not present

## 2023-03-04 DIAGNOSIS — G5603 Carpal tunnel syndrome, bilateral upper limbs: Secondary | ICD-10-CM | POA: Diagnosis not present

## 2023-03-04 DIAGNOSIS — M79642 Pain in left hand: Secondary | ICD-10-CM | POA: Diagnosis not present

## 2023-03-30 DIAGNOSIS — H25813 Combined forms of age-related cataract, bilateral: Secondary | ICD-10-CM | POA: Diagnosis not present

## 2023-03-30 DIAGNOSIS — H43813 Vitreous degeneration, bilateral: Secondary | ICD-10-CM | POA: Diagnosis not present

## 2023-03-30 DIAGNOSIS — H40013 Open angle with borderline findings, low risk, bilateral: Secondary | ICD-10-CM | POA: Diagnosis not present

## 2023-04-20 DIAGNOSIS — M13841 Other specified arthritis, right hand: Secondary | ICD-10-CM | POA: Diagnosis not present

## 2023-04-20 DIAGNOSIS — G5603 Carpal tunnel syndrome, bilateral upper limbs: Secondary | ICD-10-CM | POA: Diagnosis not present

## 2023-05-04 ENCOUNTER — Other Ambulatory Visit (INDEPENDENT_AMBULATORY_CARE_PROVIDER_SITE_OTHER): Payer: Medicare PPO

## 2023-05-04 DIAGNOSIS — E0500 Thyrotoxicosis with diffuse goiter without thyrotoxic crisis or storm: Secondary | ICD-10-CM

## 2023-05-04 LAB — TSH: TSH: 0.28 u[IU]/mL — ABNORMAL LOW (ref 0.35–5.50)

## 2023-05-04 LAB — T4, FREE: Free T4: 0.85 ng/dL (ref 0.60–1.60)

## 2023-05-25 DIAGNOSIS — M79644 Pain in right finger(s): Secondary | ICD-10-CM | POA: Diagnosis not present

## 2023-05-25 DIAGNOSIS — G5603 Carpal tunnel syndrome, bilateral upper limbs: Secondary | ICD-10-CM | POA: Diagnosis not present

## 2023-05-25 DIAGNOSIS — M6589 Other synovitis and tenosynovitis, multiple sites: Secondary | ICD-10-CM | POA: Diagnosis not present

## 2023-05-25 DIAGNOSIS — M13841 Other specified arthritis, right hand: Secondary | ICD-10-CM | POA: Diagnosis not present

## 2023-06-09 DIAGNOSIS — Z4789 Encounter for other orthopedic aftercare: Secondary | ICD-10-CM | POA: Diagnosis not present

## 2023-06-09 DIAGNOSIS — R2231 Localized swelling, mass and lump, right upper limb: Secondary | ICD-10-CM | POA: Diagnosis not present

## 2023-06-15 DIAGNOSIS — Z4789 Encounter for other orthopedic aftercare: Secondary | ICD-10-CM | POA: Diagnosis not present

## 2023-06-15 DIAGNOSIS — M7542 Impingement syndrome of left shoulder: Secondary | ICD-10-CM | POA: Diagnosis not present

## 2023-06-23 DIAGNOSIS — M1990 Unspecified osteoarthritis, unspecified site: Secondary | ICD-10-CM | POA: Diagnosis not present

## 2023-06-23 DIAGNOSIS — M2550 Pain in unspecified joint: Secondary | ICD-10-CM | POA: Diagnosis not present

## 2023-06-23 DIAGNOSIS — N1831 Chronic kidney disease, stage 3a: Secondary | ICD-10-CM | POA: Diagnosis not present

## 2023-06-23 DIAGNOSIS — G43909 Migraine, unspecified, not intractable, without status migrainosus: Secondary | ICD-10-CM | POA: Diagnosis not present

## 2023-06-23 DIAGNOSIS — I10 Essential (primary) hypertension: Secondary | ICD-10-CM | POA: Diagnosis not present

## 2023-06-23 DIAGNOSIS — J309 Allergic rhinitis, unspecified: Secondary | ICD-10-CM | POA: Diagnosis not present

## 2023-06-23 DIAGNOSIS — E7800 Pure hypercholesterolemia, unspecified: Secondary | ICD-10-CM | POA: Diagnosis not present

## 2023-06-23 DIAGNOSIS — K589 Irritable bowel syndrome without diarrhea: Secondary | ICD-10-CM | POA: Diagnosis not present

## 2023-06-23 DIAGNOSIS — K219 Gastro-esophageal reflux disease without esophagitis: Secondary | ICD-10-CM | POA: Diagnosis not present

## 2023-06-29 ENCOUNTER — Encounter: Payer: Self-pay | Admitting: Internal Medicine

## 2023-06-29 ENCOUNTER — Ambulatory Visit: Payer: Medicare PPO | Admitting: Internal Medicine

## 2023-06-29 VITALS — BP 118/72 | HR 71 | Ht 61.0 in | Wt 116.2 lb

## 2023-06-29 DIAGNOSIS — E042 Nontoxic multinodular goiter: Secondary | ICD-10-CM | POA: Diagnosis not present

## 2023-06-29 DIAGNOSIS — N1832 Chronic kidney disease, stage 3b: Secondary | ICD-10-CM

## 2023-06-29 DIAGNOSIS — E0500 Thyrotoxicosis with diffuse goiter without thyrotoxic crisis or storm: Secondary | ICD-10-CM

## 2023-06-29 LAB — COMPREHENSIVE METABOLIC PANEL
ALT: 25 U/L (ref 0–35)
AST: 26 U/L (ref 0–37)
Albumin: 4.1 g/dL (ref 3.5–5.2)
Alkaline Phosphatase: 52 U/L (ref 39–117)
BUN: 16 mg/dL (ref 6–23)
CO2: 31 meq/L (ref 19–32)
Calcium: 9.8 mg/dL (ref 8.4–10.5)
Chloride: 107 meq/L (ref 96–112)
Creatinine, Ser: 0.92 mg/dL (ref 0.40–1.20)
GFR: 64.2 mL/min (ref >60.00)
Glucose, Bld: 80 mg/dL (ref 70–99)
Potassium: 3.8 meq/L (ref 3.5–5.1)
Sodium: 144 meq/L (ref 135–145)
Total Bilirubin: 0.7 mg/dL (ref 0.2–1.2)
Total Protein: 7 g/dL (ref 6.0–8.3)

## 2023-06-29 LAB — TSH: TSH: 0.41 u[IU]/mL (ref 0.35–5.50)

## 2023-06-29 LAB — T4, FREE: Free T4: 0.99 ng/dL (ref 0.60–1.60)

## 2023-06-29 MED ORDER — METHIMAZOLE 5 MG PO TABS: 5.0000 mg | ORAL_TABLET | ORAL | 3 refills | Status: DC

## 2023-06-29 NOTE — Progress Notes (Signed)
Name: Tammy [name]  MRN/ DOB: [id number], Sep 18, 1956    Age/ Sex: 67 y.o., female     PCP: Noberto Retort, MD   Reason for Endocrinology Evaluation: Androgenic alopecia/ Graves' Disease     Initial Endocrinology Clinic Visit: 02/05/2019    PATIENT IDENTIFIER: [name] is a 67 y.o., female with a past medical history of seasonal allergies and androgenic alopecia. She has followed with [facility] since 02/05/2019 for consultative assistance with management of her androgenic alopecia.   HISTORICAL SUMMARY: The patient was referred to [place] by dermatology for the possibility of androgenic alopecia after she was found to have an elevated DHEAS of 190 (reference 12-133 mcg/dL) , with normal testosterone profile (total and free)     Pt noted excessive hair loss for ~ 4 -5 months prior to her presentation. She also noted pruritus at the area of concern and has been scratching her scalp. During dermatology visit she was noted to have hair thinning over the left vertex scalp.     She has been on some form of contraception most of her life, no prior hx of irregular period. No difficulty conceiving in the past.   Found to have graves disease with elevated TRAb and low TSH (0.14 uIu/mL ) but normal FT4 in 04/2019 and was started on methimazole.     CT abdomen 12/2020 showed NO evidence of adrenal tumor    THYROID HISTORY: Patient was noted with low TSH at 0.14 u IU/mL 04/2019.  TIBC elevated 4.49 international unit /L .   Thyroid ultrasound 11/2020 showed MNG  She was started on methimazole 07/2019   We attempted to discontinue methimazole 11/2022 with a TSH of 8.11 u IU/mL, she was on half a tablet of methimazole daily, but had to restart 02/2023 with a suppressed TSH at 0.06 u IU/mL     SUBJECTIVE:   Today (06/29/2023):  Tammy [name] is here for a follow up on androgenic alopecia ,elevated DHEAS and Graves' Disease.    She has been following with Gyn , on HRT   Denies local neck swelling, but had neck rash  Denies palpitations  Continues with chronic constipation  Denies tremors Denies hirsutism per se but plucks chin   Saw nephrology , meloxican was stopped and now on hydroxychloroquine  through [facility] for OA    Continues to take Biotin- held   Methimazole 5 mg , three days a week Monday , Wednesdays and Friday       HISTORY:  Past Medical History:  Past Medical History:  Diagnosis Date   Allergy    Past Surgical History: No past surgical history on file. Social History:  reports that she has never smoked. She has never used smokeless tobacco. No history on file for alcohol use and drug use. Family History:  Family History  Problem Relation Age of Onset   Hypertension Mother      HOME MEDICATIONS: Allergies as of 06/29/2023       Reactions   Cortisporin  [bacitra-neomycin-polymyxin-hc]         Medication List        Accurate as of June 29, 2023  8:26 AM. If you have any questions, ask your nurse or doctor.          STOP taking these medications    meloxicam 15 MG tablet Commonly known as: MOBIC Stopped by: Johnney Ou [place] Birdwell       TAKE these medications    Biotin  10000 MCG Tabs Take by mouth. Patient taking Nutrafol   calcium carbonate 1500 (600 Ca) MG Tabs tablet Commonly known as: OSCAL Take by mouth 2 (two) times daily with a meal.   CENTRUM ULTRA WOMENS PO Take by mouth.   CLARITIN PO Take by mouth.   COLLAGEN PO Take by mouth.   Diclofenac Sodium 3 % Gel   dicyclomine 20 MG tablet Commonly known as: BENTYL Take 20 mg by mouth every 6 (six) hours.   estradiol 0.05 MG/24HR patch Commonly known as: VIVELLE-DOT estradiol 0.05 mg/24 hr semiweekly transdermal patch   fluocinonide 0.05 % external solution Commonly known as: LIDEX APPLY ON THE SKIN AS DIRECTED AS NEEDED FOR ITCH   fluticasone 50 MCG/ACT nasal spray Commonly known as: FLONASE fluticasone propionate 50  mcg/actuation nasal spray,suspension   hydroxychloroquine 200 MG tablet Commonly known as: PLAQUENIL Take 200 mg by mouth 2 (two) times daily.   indapamide 1.25 MG tablet Commonly known as: LOZOL   latanoprost 0.005 % ophthalmic solution Commonly known as: XALATAN Place 1 drop into both eyes daily.   methimazole 5 MG tablet Commonly known as: TAPAZOLE Take 1 tablet (5 mg total) by mouth as directed. 1 tablet 3 times a week   olopatadine 0.1 % ophthalmic solution Commonly known as: PATANOL   pantoprazole 40 MG tablet Commonly known as: PROTONIX Take 40 mg by mouth daily.   progesterone 200 MG capsule Commonly known as: PROMETRIUM TAKE 1 CAPSULE BY MOUTH AT BEDTIME FOR 14 DAYS*EVERY 3 MONTHS   VITAMINS FOR THE HAIR PO Take by mouth.          OBJECTIVE:   PHYSICAL EXAM: VS: BP 118/72 (BP Location: Left Arm, Patient Position: Sitting, Cuff Size: Small)   Pulse 71   Ht 5\' 1"  (1.549 m)   Wt 116 lb 3.2 oz (52.7 kg)   LMP 07/26/2011   SpO2 99%   BMI 21.96 kg/m    EXAM: General: Pt appears well and is in NAD  Neck: General: Supple without adenopathy. Thyroid: Right side asymmetry noted on exam today   Lungs: Clear with good BS bilat   Heart: Auscultation: RRR.  Abdomen: Soft, nontender  Extremities:  BL LE: No pretibial edema normal   Mental Status: Judgment, insight: Intact Orientation: Oriented to time, place, and person Mood and affect: No depression, anxiety, or agitation     DATA REVIEWED:  Latest Reference Range & Units 06/29/23 08:47  Sodium 135 - 145 mEq/L 144  Potassium 3.5 - 5.1 mEq/L 3.8  Chloride 96 - 112 mEq/L 107  CO2 19 - 32 mEq/L 31  Glucose 70 - 99 mg/dL 80  BUN 6 - 23 mg/dL 16  Creatinine 1.61 - 0.96 mg/dL 0.45  Calcium 8.4 - 40.9 mg/dL 9.8  Alkaline Phosphatase 39 - 117 U/L 52  Albumin 3.5 - 5.2 g/dL 4.1  AST 0 - 37 U/L 26  ALT 0 - 35 U/L 25  Total Protein 6.0 - 8.3 g/dL 7.0  Total Bilirubin 0.2 - 1.2 mg/dL 0.7  GFR >81.19  mL/min 64.20    Latest Reference Range & Units 06/29/23 08:47  TSH 0.35 - 5.50 uIU/mL 0.41  T4,Free(Direct) 0.60 - 1.60 ng/dL 1.47      Latest Reference Range & Units 12/18/21 07:52  Sodium 135 - 145 mEq/L 143  Potassium 3.5 - 5.1 mEq/L 4.1  Chloride 96 - 112 mEq/L 104  CO2 19 - 32 mEq/L 33 (H)  Glucose 70 - 99 mg/dL 89  BUN  6 - 23 mg/dL 20  Creatinine 1.61 - 0.96 mg/dL 0.45 (H)  Calcium 8.4 - 10.5 mg/dL 40.9  GFR >81.19 mL/min 43.25 (L)     01/09/2019   Total testosterone 18 (2-45) ng/dL  Free T 1.3 (1.4-7.8 ) pg/mL  DHEAS 190 (12-133) mcg/dL     Results for LORANN, ZENDER (MRN [id number]) as of 05/18/2019 15:38  Ref. Range 05/14/2019 15:50  TRAB Latest Ref Range: <=2.00 IU/L 4.49 (H)   Thyroid Ultrasound 12/22/2021  Estimated total number of nodules >/= 1 cm: 6-10   Number of spongiform nodules >/=  2 cm not described below (TR1): 0   Number of mixed cystic and solid nodules >/= 1.5 cm not described below (TR2): 0   _________________________________________________________   Stable appearance of multifocal spongiform and solid cystic thyroid nodules bilaterally ranging from 0.9 to 1.5 cm in maximum diameters. No new discrete nodules.   IMPRESSION: Similar appearing benign multinodular goiter.   ASSESSMENT / PLAN / RECOMMENDATIONS:   1)  Hyperthyroidism Secondary to Graves' Disease  - Clinically she is euthyroid  - She held Biotin  -Repeat TFTs show normalization on current dose of methimazole, CMP normal  Medication:  Methimazole 5 mg , Monday Wednesday and Friday   2) Multinodular Goiter:  - None meets criteria for FNA based on thyroid ultrasound from 2021 and 2023 - No local neck symptoms   3) Elevated DHEAS:    - Since normalizing her thyroid , her hair growth has improved.  - We discussed that her levels are minimally elevated and in review of the literature, these levels have no clinical significance.  I suspect this is hereditary as her  sister has overt hirsutism  - Testosterone levels have been normal.  - DHEAS stable -Adrenal imaging has been normal - 12/2020  4) CKD III:  -She met with nephrology 2023, meloxicam was discontinued -GFR today has normalized -Patient to avoid NSAIDs    F/u in 6 months    Addendum: Lab results discussed with the patient on 12/22/2022 in response to a portal message where she had questions about this    Signed electronically by: Lyndle Herrlich, MD  [facility]  [facility] [address] [place] [place], [place] [address] Phone: [phone] FAX: [phone]      CC: Noberto Retort, MD [address] [place] [address] Phone: [phone]  Fax: [phone]   Return to Endocrinology clinic as below: No future appointments.

## 2023-08-04 DIAGNOSIS — M545 Low back pain, unspecified: Secondary | ICD-10-CM | POA: Diagnosis not present

## 2023-08-04 DIAGNOSIS — M79605 Pain in left leg: Secondary | ICD-10-CM | POA: Diagnosis not present

## 2023-08-30 ENCOUNTER — Ambulatory Visit: Payer: Medicare PPO | Admitting: Family Medicine

## 2023-08-30 DIAGNOSIS — N1831 Chronic kidney disease, stage 3a: Secondary | ICD-10-CM

## 2023-08-30 DIAGNOSIS — N189 Chronic kidney disease, unspecified: Secondary | ICD-10-CM | POA: Diagnosis not present

## 2023-08-30 NOTE — Progress Notes (Unsigned)
Medical Nutrition Therapy PCP Johny Blamer, MD; [facility]  Appt start time: 1430 end time: 1530 (1 hour)  Relevant history/background: Ms. Hailu was referred by PCP Johny Blamer, MD for MNT related to CKD, and she has an interest in optimizing her diet for best health.  Other diagnoses include Graves disease and sub-clinical hyperthyroidism.  She lives with her husband and grown daughter with whom she shares some meals.    Assessment:  Ms. Jakubek would like to learn how to "eat right."  She does not eat meat (dislikes) or fish (allergy)  Learning Readiness: Ready   Usual eating pattern: 2 meals and 1-2 snacks per day. Frequent foods and beverages: water, sweet tea, juice; inst flavored oatmeal/Frosted Flakes with almond milk, [place] protein shakes, veggie burgers, some fruit.   Avoided foods: meat (disliked), fish & shellfish (allergy).   Usual physical activity: just started back to working out: sporadic home calisthenics.  Has a home TM and elliptical, barbells, and dumbbells.   Sleep: Estimates 5-7 hours/night.  Bedtime ~10 PM; up 7 AM; wakes frequently, starting ~3 AM.  Food security -  Within the past 12 months, did you run out or worry that you would run out of food, and not have money to get more?  No.    24-hr recall: (Up at 7 AM) B (8:15 AM)-   1 pkt inst flavored oatmeal Snk ( AM)-   --- L (12 PM)-  1 veggie burger on bun, 1 slc cheese, 1/2 tsp mayo, 1/2 tsp chipotle sauce, 8 oz sweet tea (1/2 c sugar:1/2 gal) Snk ( PM)-  --- D (6 PM)-  2 choc chip cookies Snk ( PM)-  --- Typical day? Yes.     Nutritional Diagnosis:  {CHL AMB NUTRITIONAL [phone]}  Handouts given during visit include: After-Visit Summary (AVS) ***  Demonstrated degree of understanding via:  Teach Back  Barriers to learning/adherence to lifestyle change: ***  For behavioral goals and recommendations, see Patient Instructions.    Monitoring/Evaluation:  Dietary intake, exercise, and  body weight {follow up:15908}.

## 2023-08-30 NOTE — Patient Instructions (Addendum)
TASTE PREFERENCES ARE LEARNED.  This means it will get easier to choose foods you know are good for you if you are exposed to them enough.    Homework assignment: Complete at least 5 dinner meals on the Meal Planning form.  Bring to follow-up for feedback.    Recommendations:  1. Eat at least 3 REAL meals and 1-2 snacks per day.  Eat breakfast within one hour of getting up.  Aim for no more than 5 hours between eating.   A REAL breakfast includes at least some protein and some starch (fruit optional).   A REAL lunch or dinner includes at least some protein, some starch, and vegetables and/or fruit.   (OR: Would you serve this to a guest in your home, and call it a meal?) - Including protein at breakfast: (1) Mix some protein powder or powdered milk (1/3 cup = 1 fluid cup = 8 grams protein) into oatmeal; add some nuts/seeds. (2) 2 eggs with toast.  (3) Cold high-fiber cereal with 1 cup milk or soy milk with ~1/4 nuts/seeds.   - Including protein at lunch and dinner: (1) Cheese or peanut butter sandwich with side of fruit or raw vegs (carrots, cucumbers, cherry tomatoes; (2) Beans, rice, and greens; (3) veggie burger on bun with side salad.    2. Obtain at least 20 grams of protein per meal.   1 egg or 1 ounce of cheese provides about 7 grams of protein.  8 oz of dairy of soy milk or 2 tbsp peanut butter provides 8 grams of protein. 1 cup (8 oz) of most beans provides 15 grams of protein.    Yogurts vary greatly, but [place] yogurt is highest in protein.   DOCUMENT your progress on the above goals.    Follow-up appt on Tuesday, Dec 3 at 2:30 PM.

## 2023-09-02 DIAGNOSIS — G5603 Carpal tunnel syndrome, bilateral upper limbs: Secondary | ICD-10-CM | POA: Diagnosis not present

## 2023-09-02 DIAGNOSIS — M79642 Pain in left hand: Secondary | ICD-10-CM | POA: Diagnosis not present

## 2023-09-02 DIAGNOSIS — M154 Erosive (osteo)arthritis: Secondary | ICD-10-CM | POA: Diagnosis not present

## 2023-09-02 DIAGNOSIS — M79641 Pain in right hand: Secondary | ICD-10-CM | POA: Diagnosis not present

## 2023-09-02 DIAGNOSIS — Z6823 Body mass index (BMI) 23.0-23.9, adult: Secondary | ICD-10-CM | POA: Diagnosis not present

## 2023-09-27 ENCOUNTER — Ambulatory Visit: Payer: Medicare PPO | Admitting: Family Medicine

## 2023-10-04 ENCOUNTER — Ambulatory Visit: Payer: Medicare PPO | Admitting: Family Medicine

## 2023-10-04 DIAGNOSIS — N1831 Chronic kidney disease, stage 3a: Secondary | ICD-10-CM

## 2023-10-04 NOTE — Patient Instructions (Addendum)
Physical activity:  Keep in mind that 5 minutes of exercise is ALWAYS 5 more than zero!  Ideas to help you meet your protein needs: "Soy Crumbles" (imitation ground beef; lentils can be simmered in spaghetti sauce, or you can use tofu chunks in sauces or soups; Greek yogurt is a good protein source (try adding granola or fruit); any dried beans or peas are the classic vegetarian source of protein (canned are fine).    Recommendations:  1. Eat at least 3 REAL meals and 1-2 snacks per day.  Eat breakfast within one hour of getting up.  Aim for no more than 5 hours between eating.   A REAL breakfast includes at least some protein and some starch (fruit optional).   A REAL lunch or dinner includes at least some protein, some starch, and vegetables and/or fruit.   (OR: Would you serve this to a guest in your home, and call it a meal?) - Including protein at breakfast: (1) Mix some protein powder or powdered milk (1/3 cup = 1 fluid cup = 8 grams protein) into oatmeal; add some nuts/seeds. (2) 2 eggs with toast.  (3) Cold high-fiber cereal with 1 cup milk or soy milk with ~1/4 nuts/seeds.   - Including protein at lunch and dinner: (1) Cheese or peanut butter sandwich with side of fruit or raw vegs (carrots, cucumbers, cherry tomatoes; (2) Beans, rice, and greens; (3) veggie burger on bun with side salad.    2. Obtain at least 20 grams of protein per meal.   1 egg or 1 ounce of cheese provides about 7 grams of protein.  8 oz of dairy of soy milk or 2 tbsp peanut butter provides 8 grams of protein. 1 cup (8 oz) of most beans provides 15 grams of protein.    Yogurts vary greatly, but [place] yogurt is highest in protein.   3. Get at least 40 minutes of exercise 4 times a week.       - The 5-minute rule of exercise:  Promise yourself you will do at least 5 minutes, and if at the end of 5 minutes you don't want to continue, stop!     - Schedule your exercise each week; aim for consistency.       - Lay out  your exercise clothes and water bottle the night before.       - Set your plans for what you'll watch while you exercise.     DOCUMENT your progress on the above goals using the Goals Sheet provided today.    Follow-up:  I recommend that you follow up with [facility] at [facility], [address] [place] ([address]th floor) following my retirement.  They will call you to schedule an appt after receiving a physician referral.

## 2023-10-04 NOTE — Progress Notes (Signed)
Medical Nutrition Therapy PCP Tammy Blamer, MD; [name]  Appt start time: 1400 end time: 1500 (1 hour)  Relevant history/background: [name] was referred by PCP Tammy Blamer, MD for MNT related to CKD (stage 3a), and she has an interest in optimizing her diet for best health.  Other diagnoses include Graves disease and sub-clinical hyperthyroidism.  She lives with her husband and grown daughter with whom she shares some meals.    Assessment:  [name] brought her completed Meal Planning form from last appt, which I offered a few suggestions about.  She has made an effort to increase intake, but is still getting 2 meals/day most of the time, and there are meals at which protein is significantly below 20 grams.  Weight is stable.  She also expressed frustration at not getting into an exercise routine yet. We spent some time today discussing the difficulty she has in saying no to requests placed on her by family members, sometimes leaving little time or energy for her own self-care.    Learning Readiness: Ready   Usual eating pattern: Still 2 meals and 1-2 snacks per day.   Protein intake: Has been drinking protein drinks usually 1-2 X day, including one for breakfast and one for snack.   Weight: 121.2 lb (121 lb on 06/29/23; ht is 61"; BMI 21.96.) Usual physical activity: Home workouts 0-1 X wk.  (Has a home TM and elliptical, barbells, and dumbbells.)   Sleep: Estimates 5-7 hours/night.  Bedtime ~10 PM; up 7 AM; wakes frequently, starting ~3 AM.  Food security -  Within the past 12 months, did you run out or worry that you would run out of food, and not have money to get more?  No.    No food recall today.    Nutritional Diagnosis: No meaningful progress on NI-1.4 Inadequate energy intake As related to limited food preferences and interest.  As evidenced by patient's acknowledgement that despite efforts, she has made few dietary changes.  Handouts given during visit include: After-Visit  Summary (AVS) Goals Sheet Meal Planning form (again)  Barriers to learning/adherence to lifestyle change: "Picky eater" and poor appetite.   For behavioral goals and recommendations, see Patient Instructions.    Monitoring/Evaluation:  Dietary intake, exercise, and body weight  [facility], following my retirement .

## 2023-10-27 ENCOUNTER — Other Ambulatory Visit: Payer: Self-pay | Admitting: Gynecology

## 2023-10-27 DIAGNOSIS — Z1231 Encounter for screening mammogram for malignant neoplasm of breast: Secondary | ICD-10-CM

## 2023-12-12 ENCOUNTER — Ambulatory Visit
Admission: RE | Admit: 2023-12-12 | Discharge: 2023-12-12 | Disposition: A | Discharge status: Home / Self Care | Payer: Medicare PPO | Source: Ambulatory Visit | Attending: Gynecology | Admitting: Gynecology

## 2023-12-12 DIAGNOSIS — Z1231 Encounter for screening mammogram for malignant neoplasm of breast: Secondary | ICD-10-CM

## 2023-12-28 DIAGNOSIS — J309 Allergic rhinitis, unspecified: Secondary | ICD-10-CM | POA: Diagnosis not present

## 2023-12-28 DIAGNOSIS — I10 Essential (primary) hypertension: Secondary | ICD-10-CM | POA: Diagnosis not present

## 2023-12-28 DIAGNOSIS — M1990 Unspecified osteoarthritis, unspecified site: Secondary | ICD-10-CM | POA: Diagnosis not present

## 2023-12-28 DIAGNOSIS — E0500 Thyrotoxicosis with diffuse goiter without thyrotoxic crisis or storm: Secondary | ICD-10-CM | POA: Diagnosis not present

## 2023-12-28 DIAGNOSIS — N1831 Chronic kidney disease, stage 3a: Secondary | ICD-10-CM | POA: Diagnosis not present

## 2023-12-28 DIAGNOSIS — G43909 Migraine, unspecified, not intractable, without status migrainosus: Secondary | ICD-10-CM | POA: Diagnosis not present

## 2023-12-28 DIAGNOSIS — M2550 Pain in unspecified joint: Secondary | ICD-10-CM | POA: Diagnosis not present

## 2023-12-28 DIAGNOSIS — K219 Gastro-esophageal reflux disease without esophagitis: Secondary | ICD-10-CM | POA: Diagnosis not present

## 2023-12-28 DIAGNOSIS — E7800 Pure hypercholesterolemia, unspecified: Secondary | ICD-10-CM | POA: Diagnosis not present

## 2023-12-28 DIAGNOSIS — Z0000 Encounter for general adult medical examination without abnormal findings: Secondary | ICD-10-CM | POA: Diagnosis not present

## 2023-12-28 NOTE — Progress Notes (Signed)
 Name: Tammy [name]  MRN/ DOB: [id number], 09/14/1956    Age/ Sex: 68 y.o., female     PCP: Noberto Retort, MD   Reason for Endocrinology Evaluation: Androgenic alopecia/ Graves' Disease     Initial Endocrinology Clinic Visit: 02/05/2019    PATIENT IDENTIFIER: [name] is a 68 y.o., female with a past medical history of seasonal allergies and androgenic alopecia. She has followed with  Endocrinology clinic since 02/05/2019 for consultative assistance with management of her androgenic alopecia.   HISTORICAL SUMMARY: The patient was referred to [place] by dermatology for the possibility of androgenic alopecia after she was found to have an elevated DHEAS of 190 (reference 12-133 mcg/dL) , with normal testosterone profile (total and free)     Pt noted excessive hair loss for ~ 4 -5 months prior to her presentation. She also noted pruritus at the area of concern and has been scratching her scalp. During dermatology visit she was noted to have hair thinning over the left vertex scalp.     She has been on some form of contraception most of her life, no prior hx of irregular period. No difficulty conceiving in the past.   Found to have graves disease with elevated TRAb and low TSH (0.14 uIu/mL ) but normal FT4 in 04/2019 and was started on methimazole.     CT abdomen 12/2020 showed NO evidence of adrenal tumor    THYROID HISTORY: Patient was noted with low TSH at 0.14 u IU/mL 04/2019.  TIBC elevated 4.49 international unit /L .   Thyroid ultrasound 11/2020 showed MNG  She was started on methimazole 07/2019   We attempted to discontinue methimazole 11/2022 with a TSH of 8.11 u IU/mL, she was on half a tablet of methimazole daily, but had to restart 02/2023 with a suppressed TSH at 0.06 u IU/mL     SUBJECTIVE:   Today (12/29/2023):  Tammy [name] is here for a follow up on androgenic alopecia ,elevated DHEAS and Graves' Disease.   Has been following with Ortho for carpal  tunnel syndrome. S/P intra-articular injection  Weight stable  Denies local neck swelling Denies palpitations  Chronic constipation is improving  Denies tremors Denies hirsutism per se but has stable upper lip and chin hair growth    Continues to take Biotin- held   Methimazole 5 mg , three days a week Monday , Wednesdays and Friday       HISTORY:  Past Medical History:  Past Medical History:  Diagnosis Date   Allergy    Past Surgical History: No past surgical history on file. Social History:  reports that she has never smoked. She has never used smokeless tobacco. No history on file for alcohol use and drug use. Family History:  Family History  Problem Relation Age of Onset   Hypertension Mother      HOME MEDICATIONS: Allergies as of 12/29/2023       Reactions   Cortisporin  [bacitra-neomycin-polymyxin-hc]         Medication List        Accurate as of December 29, 2023  7:36 AM. If you have any questions, ask your nurse or doctor.          Biotin 78295 MCG Tabs Take by mouth. Patient taking Nutrafol   calcium carbonate 1500 (600 Ca) MG Tabs tablet Commonly known as: OSCAL Take by mouth 2 (two) times daily with a meal.   CENTRUM ULTRA WOMENS PO Take by mouth.  CLARITIN PO Take by mouth.   COLLAGEN PO Take by mouth.   Diclofenac Sodium 3 % Gel   dicyclomine 20 MG tablet Commonly known as: BENTYL Take 20 mg by mouth every 6 (six) hours.   estradiol 0.05 MG/24HR patch Commonly known as: VIVELLE-DOT estradiol 0.05 mg/24 hr semiweekly transdermal patch   fluocinonide 0.05 % external solution Commonly known as: LIDEX APPLY ON THE SKIN AS DIRECTED AS NEEDED FOR ITCH   fluticasone 50 MCG/ACT nasal spray Commonly known as: FLONASE fluticasone propionate 50 mcg/actuation nasal spray,suspension   hydroxychloroquine 200 MG tablet Commonly known as: PLAQUENIL Take 200 mg by mouth 2 (two) times daily.   indapamide 1.25 MG tablet Commonly  known as: LOZOL   latanoprost 0.005 % ophthalmic solution Commonly known as: XALATAN Place 1 drop into both eyes daily.   methimazole 5 MG tablet Commonly known as: TAPAZOLE Take 1 tablet (5 mg total) by mouth as directed. 1 tablet 3 times a week   olopatadine 0.1 % ophthalmic solution Commonly known as: PATANOL   pantoprazole 40 MG tablet Commonly known as: PROTONIX Take 40 mg by mouth daily.   progesterone 200 MG capsule Commonly known as: PROMETRIUM TAKE 1 CAPSULE BY MOUTH AT BEDTIME FOR 14 DAYS*EVERY 3 MONTHS   VITAMINS FOR THE HAIR PO Take by mouth.          OBJECTIVE:   PHYSICAL EXAM: VS: BP 126/82 (BP Location: Left Arm, Patient Position: Sitting, Cuff Size: Small)   Pulse 78   Ht 5\' 1"  (1.549 m)   Wt 120 lb (54.4 kg)   LMP 07/26/2011   BMI 22.67 kg/m    EXAM: General: Pt appears well and is in NAD  Neck: General: Supple without adenopathy. Thyroid: Right side asymmetry noted on exam today   Lungs: Clear with good BS bilat   Heart: Auscultation: RRR.  Abdomen: Soft, nontender  Extremities:  BL LE: No pretibial edema normal   Mental Status: Judgment, insight: Intact Orientation: Oriented to time, place, and person Mood and affect: No depression, anxiety, or agitation     DATA REVIEWED:    Latest Reference Range & Units 12/29/23 07:52  DHEA-SO4 9 - 118 mcg/dL 782 (H)  TSH 9.56 - 2.13 mIU/L 1.01  T4,Free(Direct) 0.8 - 1.8 ng/dL 1.2  (H): Data is abnormally high   Latest Reference Range & Units 06/29/23 08:47  Sodium 135 - 145 mEq/L 144  Potassium 3.5 - 5.1 mEq/L 3.8  Chloride 96 - 112 mEq/L 107  CO2 19 - 32 mEq/L 31  Glucose 70 - 99 mg/dL 80  BUN 6 - 23 mg/dL 16  Creatinine 0.86 - 5.78 mg/dL 4.69  Calcium 8.4 - 62.9 mg/dL 9.8  Alkaline Phosphatase 39 - 117 U/L 52  Albumin 3.5 - 5.2 g/dL 4.1  AST 0 - 37 U/L 26  ALT 0 - 35 U/L 25  Total Protein 6.0 - 8.3 g/dL 7.0  Total Bilirubin 0.2 - 1.2 mg/dL 0.7  GFR >52.84 mL/min 64.20       01/09/2019   Total testosterone 18 (2-45) ng/dL  Free T 1.3 (1.3-2.4 ) pg/mL  DHEAS 190 (12-133) mcg/dL     Results for RALYNN, SAN (MRN [id number]) as of 05/18/2019 15:38  Ref. Range 05/14/2019 15:50  TRAB Latest Ref Range: <=2.00 IU/L 4.49 (H)   Thyroid Ultrasound 12/22/2021  Estimated total number of nodules >/= 1 cm: 6-10   Number of spongiform nodules >/=  2 cm not described below (TR1): 0  Number of mixed cystic and solid nodules >/= 1.5 cm not described below (TR2): 0   _________________________________________________________   Stable appearance of multifocal spongiform and solid cystic thyroid nodules bilaterally ranging from 0.9 to 1.5 cm in maximum diameters. No new discrete nodules.   IMPRESSION: Similar appearing benign multinodular goiter.   ASSESSMENT / PLAN / RECOMMENDATIONS:   1)  Hyperthyroidism Secondary to Graves' Disease  - Clinically she is euthyroid  - She held Biotin  -Repeat TFTs continue to be normal, no change at this time   Medication: Continue methimazole 5 mg , Monday Wednesday and Friday   2) Multinodular Goiter:  - None meets criteria for FNA based on thyroid ultrasound from 2021 and 2023 - No local neck symptoms -I will proceed with repeat thyroid ultrasound at that has been more prominence on today's exam   3) Elevated DHEAS:      - Her levels have been  minimally elevated and in review of the literature, these levels have no clinical significance.  I suspect this is hereditary as her sister has overt hirsutism  - Testosterone levels have been normal.  -Adrenal imaging has been normal - 12/2020 -Repeat DHEA-S is trending down     F/u in 1 yr    Signed electronically by: Lyndle Herrlich, MD  [facility] Endocrinology  [facility] [address] [place], [place] [address] Phone: [phone] FAX: [phone]      CC: Noberto Retort, MD [address]  [place] [address] Phone: [phone]  Fax: [phone]   Return to Endocrinology clinic as below: No future appointments.

## 2023-12-29 ENCOUNTER — Ambulatory Visit: Payer: Medicare PPO | Admitting: Internal Medicine

## 2023-12-29 ENCOUNTER — Encounter: Payer: Self-pay | Admitting: Internal Medicine

## 2023-12-29 VITALS — BP 126/82 | HR 78 | Ht 61.0 in | Wt 120.0 lb

## 2023-12-29 DIAGNOSIS — E042 Nontoxic multinodular goiter: Secondary | ICD-10-CM

## 2023-12-29 DIAGNOSIS — E0500 Thyrotoxicosis with diffuse goiter without thyrotoxic crisis or storm: Secondary | ICD-10-CM

## 2023-12-29 DIAGNOSIS — R7989 Other specified abnormal findings of blood chemistry: Secondary | ICD-10-CM

## 2023-12-30 ENCOUNTER — Encounter: Payer: Self-pay | Admitting: Internal Medicine

## 2023-12-30 LAB — DHEA-SULFATE: DHEA-SO4: 129 ug/dL — ABNORMAL HIGH (ref 9–118)

## 2023-12-30 LAB — TSH: TSH: 1.01 m[IU]/L (ref 0.40–4.50)

## 2023-12-30 LAB — T4, FREE: Free T4: 1.2 ng/dL (ref 0.8–1.8)

## 2023-12-30 MED ORDER — METHIMAZOLE 5 MG PO TABS: 5.0000 mg | ORAL_TABLET | ORAL | 3 refills | Status: AC

## 2024-01-03 ENCOUNTER — Ambulatory Visit
Admission: RE | Admit: 2024-01-03 | Discharge: 2024-01-03 | Disposition: A | Discharge status: Home / Self Care | Source: Ambulatory Visit | Attending: Internal Medicine | Admitting: Internal Medicine

## 2024-01-03 DIAGNOSIS — E042 Nontoxic multinodular goiter: Secondary | ICD-10-CM

## 2024-01-03 DIAGNOSIS — E049 Nontoxic goiter, unspecified: Secondary | ICD-10-CM | POA: Diagnosis not present

## 2024-01-06 ENCOUNTER — Encounter: Payer: Self-pay | Admitting: Internal Medicine

## 2024-01-11 DIAGNOSIS — Z79890 Hormone replacement therapy (postmenopausal): Secondary | ICD-10-CM | POA: Diagnosis not present

## 2024-01-11 DIAGNOSIS — Z01419 Encounter for gynecological examination (general) (routine) without abnormal findings: Secondary | ICD-10-CM | POA: Diagnosis not present

## 2024-01-11 DIAGNOSIS — Z780 Asymptomatic menopausal state: Secondary | ICD-10-CM | POA: Diagnosis not present

## 2024-02-10 DIAGNOSIS — G5603 Carpal tunnel syndrome, bilateral upper limbs: Secondary | ICD-10-CM | POA: Diagnosis not present

## 2024-02-22 DIAGNOSIS — Z4789 Encounter for other orthopedic aftercare: Secondary | ICD-10-CM | POA: Diagnosis not present

## 2024-02-22 DIAGNOSIS — M13841 Other specified arthritis, right hand: Secondary | ICD-10-CM | POA: Diagnosis not present

## 2024-02-22 DIAGNOSIS — G5603 Carpal tunnel syndrome, bilateral upper limbs: Secondary | ICD-10-CM | POA: Diagnosis not present

## 2024-02-24 DIAGNOSIS — I7300 Raynaud's syndrome without gangrene: Secondary | ICD-10-CM | POA: Diagnosis not present

## 2024-02-24 DIAGNOSIS — G5601 Carpal tunnel syndrome, right upper limb: Secondary | ICD-10-CM | POA: Diagnosis not present

## 2024-02-24 DIAGNOSIS — M154 Erosive (osteo)arthritis: Secondary | ICD-10-CM | POA: Diagnosis not present

## 2024-02-24 DIAGNOSIS — Z6823 Body mass index (BMI) 23.0-23.9, adult: Secondary | ICD-10-CM | POA: Diagnosis not present

## 2024-04-12 DIAGNOSIS — H25813 Combined forms of age-related cataract, bilateral: Secondary | ICD-10-CM | POA: Diagnosis not present

## 2024-04-12 DIAGNOSIS — H40013 Open angle with borderline findings, low risk, bilateral: Secondary | ICD-10-CM | POA: Diagnosis not present

## 2024-04-12 DIAGNOSIS — H43813 Vitreous degeneration, bilateral: Secondary | ICD-10-CM | POA: Diagnosis not present

## 2024-07-11 DIAGNOSIS — K219 Gastro-esophageal reflux disease without esophagitis: Secondary | ICD-10-CM | POA: Diagnosis not present

## 2024-07-11 DIAGNOSIS — E0500 Thyrotoxicosis with diffuse goiter without thyrotoxic crisis or storm: Secondary | ICD-10-CM | POA: Diagnosis not present

## 2024-07-11 DIAGNOSIS — I10 Essential (primary) hypertension: Secondary | ICD-10-CM | POA: Diagnosis not present

## 2024-07-11 DIAGNOSIS — Z23 Encounter for immunization: Secondary | ICD-10-CM | POA: Diagnosis not present

## 2024-07-11 DIAGNOSIS — M1990 Unspecified osteoarthritis, unspecified site: Secondary | ICD-10-CM | POA: Diagnosis not present

## 2024-07-11 DIAGNOSIS — K580 Irritable bowel syndrome with diarrhea: Secondary | ICD-10-CM | POA: Diagnosis not present

## 2024-07-11 DIAGNOSIS — J309 Allergic rhinitis, unspecified: Secondary | ICD-10-CM | POA: Diagnosis not present

## 2024-08-20 DIAGNOSIS — H40013 Open angle with borderline findings, low risk, bilateral: Secondary | ICD-10-CM | POA: Diagnosis not present

## 2024-08-20 DIAGNOSIS — H43813 Vitreous degeneration, bilateral: Secondary | ICD-10-CM | POA: Diagnosis not present

## 2024-08-20 DIAGNOSIS — H25813 Combined forms of age-related cataract, bilateral: Secondary | ICD-10-CM | POA: Diagnosis not present

## 2024-08-31 DIAGNOSIS — Z6822 Body mass index (BMI) 22.0-22.9, adult: Secondary | ICD-10-CM | POA: Diagnosis not present

## 2024-08-31 DIAGNOSIS — G5601 Carpal tunnel syndrome, right upper limb: Secondary | ICD-10-CM | POA: Diagnosis not present

## 2024-08-31 DIAGNOSIS — Z79899 Other long term (current) drug therapy: Secondary | ICD-10-CM | POA: Diagnosis not present

## 2024-08-31 DIAGNOSIS — M79642 Pain in left hand: Secondary | ICD-10-CM | POA: Diagnosis not present

## 2024-08-31 DIAGNOSIS — M154 Erosive (osteo)arthritis: Secondary | ICD-10-CM | POA: Diagnosis not present

## 2024-08-31 DIAGNOSIS — M79641 Pain in right hand: Secondary | ICD-10-CM | POA: Diagnosis not present

## 2024-08-31 DIAGNOSIS — I7300 Raynaud's syndrome without gangrene: Secondary | ICD-10-CM | POA: Diagnosis not present

## 2024-09-28 DIAGNOSIS — H25811 Combined forms of age-related cataract, right eye: Secondary | ICD-10-CM | POA: Diagnosis not present

## 2024-09-28 DIAGNOSIS — H5371 Glare sensitivity: Secondary | ICD-10-CM | POA: Diagnosis not present

## 2024-11-05 ENCOUNTER — Other Ambulatory Visit: Payer: Self-pay | Admitting: Family Medicine

## 2024-11-05 DIAGNOSIS — Z1231 Encounter for screening mammogram for malignant neoplasm of breast: Secondary | ICD-10-CM

## 2024-12-13 ENCOUNTER — Ambulatory Visit

## 2025-01-01 ENCOUNTER — Ambulatory Visit: Admitting: Internal Medicine
